# Patient Record
Sex: Male | Born: 1985
Health system: Southern US, Community
[De-identification: ages and names within clinical notes are randomized; demographics above are authoritative.]

## PROBLEM LIST (undated history)

## (undated) DIAGNOSIS — F419 Anxiety disorder, unspecified: Secondary | ICD-10-CM

## (undated) DIAGNOSIS — S0230XA Fracture of orbital floor, unspecified side, initial encounter for closed fracture: Secondary | ICD-10-CM

---

## 2004-08-18 ENCOUNTER — Emergency Department (HOSPITAL_COMMUNITY): Admission: EM | Admit: 2004-08-18 | Discharge: 2004-08-18 | Payer: Self-pay | Admitting: Emergency Medicine

## 2004-08-30 ENCOUNTER — Emergency Department (HOSPITAL_COMMUNITY): Admission: EM | Admit: 2004-08-30 | Discharge: 2004-08-30 | Payer: Self-pay | Admitting: Emergency Medicine

## 2004-09-01 ENCOUNTER — Emergency Department (HOSPITAL_COMMUNITY): Admission: EM | Admit: 2004-09-01 | Discharge: 2004-09-01 | Payer: Self-pay | Admitting: Emergency Medicine

## 2004-12-10 ENCOUNTER — Ambulatory Visit: Payer: Self-pay | Admitting: *Deleted

## 2005-01-08 ENCOUNTER — Ambulatory Visit: Payer: Self-pay

## 2005-01-08 ENCOUNTER — Ambulatory Visit: Payer: Self-pay | Admitting: *Deleted

## 2005-01-27 ENCOUNTER — Emergency Department (HOSPITAL_COMMUNITY): Admission: EM | Admit: 2005-01-27 | Discharge: 2005-01-28 | Payer: Self-pay | Admitting: Emergency Medicine

## 2007-08-04 ENCOUNTER — Emergency Department (HOSPITAL_COMMUNITY): Admission: EM | Admit: 2007-08-04 | Discharge: 2007-08-04 | Payer: Self-pay | Admitting: Emergency Medicine

## 2011-07-30 LAB — I-STAT 8, (EC8 V) (CONVERTED LAB)
Acid-Base Excess: 1
BUN: 11
Bicarbonate: 27.6 — ABNORMAL HIGH
Chloride: 106
Glucose, Bld: 92
HCT: 47
Hemoglobin: 16
Operator id: 285491
Potassium: 3.9
Sodium: 140
TCO2: 29
pCO2, Ven: 48.3
pH, Ven: 7.365 — ABNORMAL HIGH

## 2013-12-24 ENCOUNTER — Emergency Department (HOSPITAL_COMMUNITY): Payer: Self-pay

## 2013-12-24 ENCOUNTER — Emergency Department (HOSPITAL_COMMUNITY)
Admission: EM | Admit: 2013-12-24 | Discharge: 2013-12-24 | Disposition: A | Discharge status: Home / Self Care | Payer: Self-pay | Attending: Emergency Medicine | Admitting: Emergency Medicine

## 2013-12-24 ENCOUNTER — Encounter (HOSPITAL_COMMUNITY): Payer: Self-pay | Admitting: Emergency Medicine

## 2013-12-24 DIAGNOSIS — R0789 Other chest pain: Secondary | ICD-10-CM | POA: Insufficient documentation

## 2013-12-24 DIAGNOSIS — R42 Dizziness and giddiness: Secondary | ICD-10-CM | POA: Insufficient documentation

## 2013-12-24 DIAGNOSIS — F17200 Nicotine dependence, unspecified, uncomplicated: Secondary | ICD-10-CM | POA: Insufficient documentation

## 2013-12-24 DIAGNOSIS — Z880 Allergy status to penicillin: Secondary | ICD-10-CM | POA: Insufficient documentation

## 2013-12-24 DIAGNOSIS — R0602 Shortness of breath: Secondary | ICD-10-CM | POA: Insufficient documentation

## 2013-12-24 LAB — BASIC METABOLIC PANEL
BUN: 11 mg/dL (ref 6–23)
CO2: 24 mEq/L (ref 19–32)
Calcium: 9.7 mg/dL (ref 8.4–10.5)
Chloride: 100 mEq/L (ref 96–112)
Creatinine, Ser: 0.89 mg/dL (ref 0.50–1.35)
GFR calc Af Amer: >90 mL/min (ref >90)
GFR calc non Af Amer: >90 mL/min (ref >90)
Glucose, Bld: 102 mg/dL — ABNORMAL HIGH (ref 70–99)
Potassium: 3.4 mEq/L — ABNORMAL LOW (ref 3.7–5.3)
Sodium: 139 mEq/L (ref 137–147)

## 2013-12-24 LAB — CBC WITH DIFFERENTIAL/PLATELET
Basophils Absolute: 0 10*3/uL (ref 0.0–0.1)
Basophils Relative: 0 % (ref 0–1)
Eosinophils Absolute: 0.1 10*3/uL (ref 0.0–0.7)
Eosinophils Relative: 1 % (ref 0–5)
HCT: 40.9 % (ref 39.0–52.0)
Hemoglobin: 14.4 g/dL (ref 13.0–17.0)
Lymphocytes Relative: 33 % (ref 12–46)
Lymphs Abs: 2.3 10*3/uL (ref 0.7–4.0)
MCH: 32 pg (ref 26.0–34.0)
MCHC: 35.2 g/dL (ref 30.0–36.0)
MCV: 90.9 fL (ref 78.0–100.0)
Monocytes Absolute: 0.6 10*3/uL (ref 0.1–1.0)
Monocytes Relative: 9 % (ref 3–12)
Neutro Abs: 4 10*3/uL (ref 1.7–7.7)
Neutrophils Relative %: 57 % (ref 43–77)
Platelets: 196 10*3/uL (ref 150–400)
RBC: 4.5 MIL/uL (ref 4.22–5.81)
RDW: 11.7 % (ref 11.5–15.5)
WBC: 7 10*3/uL (ref 4.0–10.5)

## 2013-12-24 LAB — I-STAT TROPONIN, ED: Troponin i, poc: 0 ng/mL (ref 0.00–0.08)

## 2013-12-24 MED ORDER — LORAZEPAM 1 MG PO TABS: 1.0000 mg | ORAL_TABLET | Freq: Three times a day (TID) | ORAL | Status: DC | PRN

## 2013-12-24 MED ORDER — LORAZEPAM 1 MG PO TABS
1.0000 mg | ORAL_TABLET | Freq: Once | ORAL | Status: AC
  Administered 2013-12-24: 1 mg via ORAL
  Filled 2013-12-24: qty 1

## 2013-12-24 NOTE — ED Provider Notes (Signed)
CSN: [id number]     Arrival date & time 12/24/13  0017 History   First MD Initiated Contact with Patient 12/24/13 0129     Chief Complaint  Patient presents with  . Shortness of Breath     (Consider location/radiation/quality/duration/timing/severity/associated sxs/prior Treatment) HPI Jose Mccullough is a 28 y.o. male who presents emergency department complaining of shortness of breath. He states his shortness of breath began when he got home after work and was relaxing. He states it started after he took a shower with sitting on a couch. He states he has been feeling dizzy the last 2 days, otherwise no symptoms. He denies any associated chest pain, dizziness, diaphoresis, lightheadedness. He denies any history of any similar symptoms in the past. He denies any medical problems. He states he has been under a lot of stress recently. He states he's eating and drinking and sleeping well. He denies any recent travels or surgeries. He denies any swelling in his extremities. He denies any exertional chest pain or shortness of breath. He states this is an emergency department his symptoms have improved. He denies any fever, chills. He denies any cough or congestion.  History reviewed. No pertinent past medical history. History reviewed. No pertinent past surgical history. No family history on file. History  Substance Use Topics  . Smoking status: Current Every Day Smoker    Types: Cigarettes  . Smokeless tobacco: Not on file  . Alcohol Use: Yes     Comment: daily    Review of Systems  Constitutional: Negative for fever and chills.  Respiratory: Positive for chest tightness and shortness of breath. Negative for cough.   Cardiovascular: Negative for chest pain, palpitations and leg swelling.  Gastrointestinal: Negative for nausea, vomiting, abdominal pain, diarrhea and abdominal distention.  Genitourinary: Negative for dysuria, urgency, frequency and hematuria.  Musculoskeletal: Negative for  arthralgias, myalgias, neck pain and neck stiffness.  Skin: Negative for rash.  Allergic/Immunologic: Negative for immunocompromised state.  Neurological: Positive for dizziness and light-headedness. Negative for weakness, numbness and headaches.      Allergies  Penicillins  Home Medications  No current outpatient prescriptions on file. BP 137/93  Pulse 101  Temp(Src) 97.8 F (36.6 C) (Oral)  Resp 20  Ht 5\' 11"  (1.803 m)  Wt 155 lb (70.308 kg)  BMI 21.63 kg/m2  SpO2 100% Physical Exam  Nursing note and vitals reviewed. Constitutional: He appears well-developed and well-nourished. No distress.  HENT:  Head: Normocephalic and atraumatic.  Eyes: Conjunctivae are normal.  Neck: Neck supple.  Cardiovascular: Normal rate, regular rhythm and normal heart sounds.   Pulmonary/Chest: Effort normal. No respiratory distress. He has no wheezes. He has no rales. He exhibits no tenderness.  Musculoskeletal: He exhibits no edema.  Neurological: He is alert.  Skin: Skin is warm and dry.    ED Course  Procedures (including critical care time) Labs Review Labs Reviewed  BASIC METABOLIC PANEL - Abnormal; Notable for the following:    Potassium 3.4 (*)    Glucose, Bld 102 (*)    All other components within normal limits  CBC WITH DIFFERENTIAL  I-STAT TROPOININ, ED   Imaging Review Dg Chest 2 View  12/24/2013   CLINICAL DATA:  Shortness of breath with legs in the chest.  Smoker.  EXAM: CHEST  2 VIEW  COMPARISON:  08/30/2004.  FINDINGS: The heart size and mediastinal contours are within normal limits. Both lungs are clear. The visualized skeletal structures are unremarkable. Similar appearance to priors.  IMPRESSION: No  active cardiopulmonary disease.   Electronically Signed   By: Davonna BellingJohn  Curnes M.D.   On: 12/24/2013 02:41     EKG Interpretation None      MDM   Final diagnoses:  Shortness of breath  Dizziness   Patient with atypical chest pressure, shortness of breath that was at  rest. He denies any exertional symptoms ever. His exam is unremarkable. He is in no distress. His vital signs are normal. He is afebrile. EKG showing sinus bradycardia otherwise normal. Labs including troponin was checked and are negative. Chest x-ray is negative. Patient admits to a lot of stress recently and I have given him Ativan because he appeared slightly anxious on exam. Patient states that his symptoms did improve on her Ativan and currently he is asymptomatic. Patient is concerned about this chest pressure shortness of breath and requesting cardiology referral. Will refer to cardiology on call. He is also instructed to followup with her primary care Dr. Sheila Oatsesource guide provided. Patient is a low risk patient for ACS. No recent travel or risk factors for a PE. He is currently symptom-free. Stable for discharge home with close followup.  Filed Vitals:   12/24/13 0026 12/24/13 0042 12/24/13 0418  BP: 137/93  107/71  Pulse: 101  56  Temp: 97.8 F (36.6 C)    TempSrc: Oral    Resp: 20  17  Height:  5\' 11"  (1.803 m)   Weight:  155 lb (70.308 kg)   SpO2: 100%  100%      Lottie Musselatyana A Zach Tietje, PA-C 12/24/13 [phone]

## 2013-12-24 NOTE — Discharge Instructions (Signed)
Avoid stress. Drink plenty of fluids. If continue to have dizzy episodes or shortness of breath, follow up with primary care doctor or cardiology. Ativan as needed for anxiety. Return if symptoms are worsening.    Shortness of Breath Shortness of breath means you have trouble breathing. Shortness of breath may indicate that you have a medical problem. You should seek immediate medical care for shortness of breath. CAUSES   Not enough oxygen in the air (as with high altitudes or a smoke-filled room).  Short-term (acute) lung disease, including:  Infections, such as pneumonia.  Fluid in the lungs, such as heart failure.  A blood clot in the lungs (pulmonary embolism).  Long-term (chronic) lung diseases.  Heart disease (heart attack, angina, heart failure, and others).  Low red blood cells (anemia).  Poor physical fitness. This can cause shortness of breath when you exercise.  Chest or back injuries or stiffness.  Being overweight.  Smoking.  Anxiety. This can make you feel like you are not getting enough air. DIAGNOSIS  Serious medical problems can usually be found during your physical exam. Tests may also be done to determine why you are having shortness of breath. Tests may include:  Chest X-rays.  Lung function tests.  Blood tests.  Electrocardiography.  Exercise testing.  Echocardiography.  Imaging scans. Your caregiver may not be able to find a cause for your shortness of breath after your exam. In this case, it is important to have a follow-up exam with your caregiver as directed.  TREATMENT  Treatment for shortness of breath depends on the cause of your symptoms and can vary greatly. HOME CARE INSTRUCTIONS   Do not smoke. Smoking is a common cause of shortness of breath. If you smoke, ask for help to quit.  Avoid being around chemicals or things that may bother your breathing, such as paint fumes and dust.  Rest as needed. Slowly resume your usual  activities.  If medicines were prescribed, take them as directed for the full length of time directed. This includes oxygen and any inhaled medicines.  Keep all follow-up appointments as directed by your caregiver. SEEK MEDICAL CARE IF:   Your condition does not improve in the time expected.  You have a hard time doing your normal activities even with rest.  You have any side effects or problems with the medicines prescribed.  You develop any new symptoms. SEEK IMMEDIATE MEDICAL CARE IF:   Your shortness of breath gets worse.  You feel lightheaded, faint, or develop a cough not controlled with medicines.  You start coughing up blood.  You have pain with breathing.  You have chest pain or pain in your arms, shoulders, or abdomen.  You have a fever.  You are unable to walk up stairs or exercise the way you normally do. MAKE SURE YOU:  Understand these instructions.  Will watch your condition.  Will get help right away if you are not doing well or get worse. Document Released: 07/01/2001 Document Revised: 04/06/2012 Document Reviewed: 12/22/2011 [facility] Patient Information 2014 [place], [place].    Emergency Department Resource Guide 1) Find a Doctor and Pay Out of Pocket Although you won't have to find out who is covered by your insurance plan, it is a good idea to ask around and get recommendations. You will then need to call the office and see if the doctor you have chosen will accept you as a new patient and what types of options they offer for patients who are self-pay. Some  doctors offer discounts or will set up payment plans for their patients who do not have insurance, but you will need to ask so you aren't surprised when you get to your appointment.  2) Contact Your Local Health Department Not all health departments have doctors that can see patients for sick visits, but many do, so it is worth a call to see if yours does. If you don't know where your local health  department is, you can check in your phone book. The CDC also has a tool to help you locate your state's health department, and many state websites also have listings of all of their local health departments.  3) Find a Walk-in Clinic If your illness is not likely to be very severe or complicated, you may want to try a walk in clinic. These are popping up all over the country in pharmacies, drugstores, and shopping centers. They're usually staffed by nurse practitioners or physician assistants that have been trained to treat common illnesses and complaints. They're usually fairly quick and inexpensive. However, if you have serious medical issues or chronic medical problems, these are probably not your best option.  No Primary Care Doctor: - Call Health Connect at  [phone] - they can help you locate a primary care doctor that  accepts your insurance, provides certain services, etc. - Physician Referral Service- [phone]  Chronic Pain Problems: Organization         Address  Phone   Notes  [facility]  [phone] Patients need to be referred by their primary care doctor.   Medication Assistance: Organization         Address  Phone   Notes  [facility] Medication [facility] [address] [place] [place], [place] [address] [phone] --Must be a resident of [facility] -- Must have NO insurance coverage whatsoever (no Medicaid/ Medicare, etc.) -- The pt. MUST have a primary care doctor that directs their care regularly and follows them in the community   [facility]  [phone]   [employer]  [phone]    Agencies that provide inexpensive medical care: Organization         Address  Phone   Notes  [facility]  [phone]   [facility]    [phone]   [facility] [address] [place], [place] [address] [phone]   [facility] [address] [place] [place] [phone]   Planned Parenthood    [phone]   [facility]    [phone]   [facility]  [address], [place] Phone:  [phone], Fax:  [phone] Hours of Operation:  9 am - 6 pm, M-F.  Also accepts Medicaid/Medicare and self-pay.  [facility]  [address], [place] Phone: [phone], Fax: [phone]. Hours of Operation:  8:30 am - 5:30 pm, M-F.  Also accepts Medicaid and self-pay.  [facility] [place] [address], [place] Phone: [phone]   [facility] [address] Natasha Bence [place], [place] [phone], Ext. [phone] Mondays & Thursdays: 7-9 AM.  First 15 patients are seen on a first come, first serve basis.    Medicaid-accepting [facility] Providers:  Organization         Address  Phone   Notes  [facility] [address], [place] [phone] Also accepts self-pay patients.  [facility] [address] Laurell Josephs [place], [place]  [phone]   [facility] [address], [place] [phone]   [facility] [address] [place] [phone]   Renaye Rakers [address], [place]   [phone] Only accepts [place] patients after they have their name applied to their card.   Self-Pay (no insurance) in [facility]:  Organization         Address  Phone   Notes  Sickle Cell Patients, [facility] [address] [place], [place] [phone]   [facility] [address] [place], [place] [phone]   [facility] [place]  [address],  [phone]   [facility]/Dr. Osei-Bonsu  [address] [place] or [address], [place] [phone] Phone number for both [place] and  [place] locations is the same.  [facility] [address] [facility] [address], [place] [phone]   [facility] [address], [place] or [address] [phone]   [facility] [address], [place] [phone], phone; [phone], fax Sees patients 1st and 3rd Saturday of every month.  Must not qualify for public or private insurance (i.e. Medicaid, Medicare, [place] Health Choice, Veterans' Benefits)  Household income should be no more than 200% of the poverty level The clinic cannot treat you if you are pregnant or think you are pregnant  Sexually transmitted diseases are not treated at the clinic.    Dental Care: Organization         Address  Phone  Notes  [facility] [address] [place], [place] [phone] Accepts children up to age 14 who are enrolled in [place] or [place] Health Choice; pregnant women with a Medicaid card; and children who have applied for Medicaid or [place] Health Choice, but were declined, whose parents can pay a reduced fee at time of service.  [facility] Department of [facility]  [address], [place] [phone] Accepts children up to age 33 who are enrolled in [place] or [place] Health Choice; pregnant women with a Medicaid card; and children who have applied for Medicaid or [place] Health Choice, but were declined, whose parents can pay a reduced fee at time of service.  [facility]  [address] [place], [place] [phone] Patients are seen by appointment only. Walk-ins are not accepted. [facility] will see patients 40 years of age and older. Monday - Tuesday (8am-5pm) Most Wednesdays (8:30-5pm) $30 per visit, cash only  [facility]  [address], [facility] [phone] Patients are seen by appointment only. Walk-ins are not accepted.  [facility] will see patients 34 years of age and older. One Wednesday Evening (Monthly: Volunteer Based).  $30 per visit, cash only  [employer]  [phone] for adults; Children under age 43, call Graduate Pediatric Dentistry at [phone]. Children aged 51-14, please call [phone] to request a pediatric application.  Dental services are provided in all areas of dental care including fillings, crowns and bridges, complete and partial dentures, implants, gum treatment, root canals, and extractions. Preventive  care is also provided. Treatment is provided to both adults and children. Patients are selected via a lottery and there is often a waiting list.   [facility] [address], [place]  [phone] [email or web address]   [facility] [address], [place], [place] [phone], Ext. [phone] Second and Fourth Thursday of each month, opens at 6:30 AM; Clinic ends at 9 AM.  Patients are seen on a first-come first-served basis, and a limited number are seen during each clinic.   [facility]  [address], [place], [place] [phone]   Eligibility Requirements You must have lived in [place], [place], or [place] counties for at least the last three months.   You cannot be eligible for state or federal sponsored [place] Cityhealthcare insurance, including CIGNAVeterans Administration, [place]icaid, or [employer] EntertainmentMedicare.   You generally cannot be eligible for healthcare insurance through your employer.    How to apply: Eligibility screenings are held every Tuesday and Wednesday afternoon from 1:00 pm until 4:00 pm. You do not need an appointment for the interview!  [facility] [address], [place], [place] [phone]   [facility]  [phone]   [facility]  [phone]   [facility]  [phone]    Behavioral Health Resources in the  Community: Intensive Outpatient Programs Organization         Address  Phone  Notes  [facility] [address], [place], [place] [phone]   [facility] [address], [place], [place] [phone]   [facility] [address], [place], [place]  [phone]   [facility] [address],  [place], [place] [phone] or [phone]   Substance Abuse Resources Organization         Address  Phone  Notes  [facility]  [phone]   [facility]  [phone]   [facility]  [phone]   [facility]  [phone]   Residential & Outpatient Substance Abuse Program  [phone]   Psychological Services Organization         Address  Phone  Notes  [facility]  [phone]- [phone]   [facility]  [phone]- [phone]   [facility] [address], [place] [phone] or [phone]    Mobile Crisis Teams Organization         Address  Phone  Notes  [facility], Mobile Crisis Care Unit  [phone]   [facility]  [address] [place], [place] [phone]   Doristine LocksSharon DeEsch [address] [place] [phone]    Self-Help/Support Groups Organization         Address  Phone             Notes  [facility] [place] - variety of support groups  336- [phone] Call for more information  Narcotics Anonymous (NA), [facility] [address], [place]  2 meetings at this location   Statisticianesidential Treatment Programs Organization         Address  Phone  Notes  [facility] [address],    [place]  [phone]   [facility]  [address], [place] [phone], [place], [place] [phone]   [facility] [address], [place] [phone] Admissions: 8am-3pm M-F  [facility] [address]  [place], [place]   [facility] [address] Jadene Pierini [place], [place]   The [place].,  [place], [place]   [facility] - Intensive Outpatient [address], [place], [place]   [facility] [address]  [place], [place] [phone] or [phone]   [facility] ([facility]) [address] [place], [place] Accepts Medicaid  [facility] [address],  [place] [phone] Substance Abuse/Addiction Treatment   [facility] Organization         Address  Phone  Notes  [facility]  [phone]   Domenic Schwab, PhD [address] Arlis Porta [place], [place]   [phone] or [phone]   [facility] [place], [place] [phone]   [facility] [address], [place], [place] [phone] Insurance/Medicaid/sponsorship through [facility] and Families [address] [address]                                    [place], [place] [phone] [place] [address], [place] [phone]    Dr. Adele Schilder  [phone]   [facility] Dept. 1) [address], [place] 2) [place] 3)  [place] [address], [place] [phone]  [phone]   [place] [phone] or [phone] (After Hours)

## 2013-12-24 NOTE — ED Provider Notes (Signed)
Medical screening examination/treatment/procedure(s) were performed by non-physician practitioner and as supervising physician I was immediately available for consultation/collaboration.     Julie Manly, MD 12/24/13 0715 

## 2013-12-24 NOTE — ED Notes (Signed)
Pt states he got off work @ [employer] tonight, went home, showered after sitting down became [facility], pt c/o continually feeling he may pass out. A & O at this time.

## 2014-12-21 ENCOUNTER — Emergency Department (HOSPITAL_COMMUNITY)
Admission: EM | Admit: 2014-12-21 | Discharge: 2014-12-21 | Disposition: A | Discharge status: Home / Self Care | Payer: Self-pay | Attending: Emergency Medicine | Admitting: Emergency Medicine

## 2014-12-21 ENCOUNTER — Encounter (HOSPITAL_COMMUNITY): Payer: Self-pay | Admitting: Emergency Medicine

## 2014-12-21 ENCOUNTER — Emergency Department (HOSPITAL_COMMUNITY): Payer: Self-pay

## 2014-12-21 DIAGNOSIS — K5900 Constipation, unspecified: Secondary | ICD-10-CM | POA: Insufficient documentation

## 2014-12-21 DIAGNOSIS — Z720 Tobacco use: Secondary | ICD-10-CM | POA: Insufficient documentation

## 2014-12-21 DIAGNOSIS — Z880 Allergy status to penicillin: Secondary | ICD-10-CM | POA: Insufficient documentation

## 2014-12-21 DIAGNOSIS — Z79899 Other long term (current) drug therapy: Secondary | ICD-10-CM | POA: Insufficient documentation

## 2014-12-21 DIAGNOSIS — R110 Nausea: Secondary | ICD-10-CM | POA: Insufficient documentation

## 2014-12-21 DIAGNOSIS — R42 Dizziness and giddiness: Secondary | ICD-10-CM | POA: Insufficient documentation

## 2014-12-21 LAB — COMPREHENSIVE METABOLIC PANEL
ALT: 24 U/L (ref 0–53)
AST: 21 U/L (ref 0–37)
Albumin: 4.5 g/dL (ref 3.5–5.2)
Alkaline Phosphatase: 48 U/L (ref 39–117)
Anion gap: 7 (ref 5–15)
BUN: 8 mg/dL (ref 6–23)
CO2: 25 mmol/L (ref 19–32)
Calcium: 9.3 mg/dL (ref 8.4–10.5)
Chloride: 109 mmol/L (ref 96–112)
Creatinine, Ser: 0.88 mg/dL (ref 0.50–1.35)
GFR calc Af Amer: >90 mL/min (ref >90)
GFR calc non Af Amer: >90 mL/min (ref >90)
Glucose, Bld: 91 mg/dL (ref 70–99)
Potassium: 4 mmol/L (ref 3.5–5.1)
Sodium: 141 mmol/L (ref 135–145)
TOTAL PROTEIN: 7.3 g/dL (ref 6.0–8.3)
Total Bilirubin: 1.4 mg/dL — ABNORMAL HIGH (ref 0.3–1.2)

## 2014-12-21 LAB — CBC
HCT: 43.1 % (ref 39.0–52.0)
Hemoglobin: 14.9 g/dL (ref 13.0–17.0)
MCH: 33.3 pg (ref 26.0–34.0)
MCHC: 34.6 g/dL (ref 30.0–36.0)
MCV: 96.4 fL (ref 78.0–100.0)
Platelets: 207 10*3/uL (ref 150–400)
RBC: 4.47 MIL/uL (ref 4.22–5.81)
RDW: 11.9 % (ref 11.5–15.5)
WBC: 5.5 10*3/uL (ref 4.0–10.5)

## 2014-12-21 LAB — I-STAT TROPONIN, ED: Troponin i, poc: 0 ng/mL (ref 0.00–0.08)

## 2014-12-21 LAB — CBG MONITORING, ED: Glucose-Capillary: 93 mg/dL (ref 70–99)

## 2014-12-21 MED ORDER — POLYETHYLENE GLYCOL 3350 17 G PO PACK: 17.0000 g | PACK | Freq: Every day | ORAL | Status: DC

## 2014-12-21 MED ORDER — SODIUM CHLORIDE 0.9 % IV BOLUS (SEPSIS)
1000.0000 mL | Freq: Once | INTRAVENOUS | Status: AC
  Administered 2014-12-21: 1000 mL via INTRAVENOUS

## 2014-12-21 NOTE — ED Notes (Signed)
Pt states that he has been feeling faint x every day for 2 wks.  States he is "kind of nauseous" and has been constipated.  Last normal BM was a week ago.  Since then BMs have been hard.

## 2014-12-21 NOTE — ED Provider Notes (Signed)
CSN: [id number]     Arrival date & time 12/21/14  1524 History   First MD Initiated Contact with Patient 12/21/14 1534     Chief Complaint  Patient presents with  . Dizziness     (Consider location/radiation/quality/duration/timing/severity/associated sxs/prior Treatment) The history is provided by the patient.  Jose Mccullough is a 29 y.o. male smoker here presenting with lightheadedness and dizziness, constipation. Has been feeling lightheaded and dizzy for the last 2 weeks. He felt like passing out but hasn't actually passed out. He had intermittent left-sided chest pain going on for 2 weeks. Denies any shortness of breath. Has been drinking and eating well but sometimes get nauseated but no vomiting. He's been having some hard bowel movements the last week. Denies any fevers or chills or abdominal pain. Otherwise healthy with no medical problems.    History reviewed. No pertinent past medical history. No past surgical history on file. No family history on file. History  Substance Use Topics  . Smoking status: Current Every Day Smoker    Types: Cigarettes  . Smokeless tobacco: Not on file  . Alcohol Use: Yes     Comment: daily    Review of Systems  Neurological: Positive for dizziness.  All other systems reviewed and are negative.     Allergies  Ativan and Penicillins  Home Medications   Prior to Admission medications   Medication Sig Start Date End Date Taking? Authorizing Provider  Cyanocobalamin (VITAMIN B-12 PO) Take 1 tablet by mouth daily.   Yes Historical Provider, MD  LORazepam (ATIVAN) 1 MG tablet Take 1 tablet (1 mg total) by mouth 3 (three) times daily as needed for anxiety. Patient not taking: Reported on 12/21/2014 12/24/13   Tatyana A Kirichenko, PA-C   BP 122/82 mmHg  Pulse 49  Temp(Src) 98.3 F (36.8 C) (Oral)  Resp 19  SpO2 100% Physical Exam  Constitutional: He is oriented to person, place, and time. He appears well-developed and well-nourished.   HENT:  Head: Normocephalic.  Mouth/Throat: Oropharynx is clear and moist.  Eyes: Conjunctivae are normal. Pupils are equal, round, and reactive to light.  Neck: Normal range of motion. Neck supple.  Cardiovascular: Normal rate, regular rhythm and normal heart sounds.   Pulmonary/Chest: Effort normal and breath sounds normal. No respiratory distress. He has no wheezes. He has no rales.  Abdominal: Soft. Bowel sounds are normal. He exhibits no distension. There is no tenderness. There is no rebound.  Musculoskeletal: Normal range of motion. He exhibits no edema or tenderness.  Neurological: He is alert and oriented to person, place, and time. No cranial nerve deficit. Coordination normal.  Skin: Skin is warm and dry.  Psychiatric: He has a normal mood and affect. His behavior is normal. Judgment and thought content normal.  Nursing note and vitals reviewed.   ED Course  Procedures (including critical care time) Labs Review Labs Reviewed  COMPREHENSIVE METABOLIC PANEL - Abnormal; Notable for the following:    Total Bilirubin 1.4 (*)    All other components within normal limits  CBC  CBG MONITORING, ED  Rosezena Sensor, ED    Imaging Review Dg Chest 2 View  12/21/2014   CLINICAL DATA:  Chest pain for 2 weeks.  EXAM: CHEST  2 VIEW  COMPARISON:  December 24, 2013.  FINDINGS: The heart size and mediastinal contours are within normal limits. Both lungs are clear. No pneumothorax or pleural effusion is noted. The visualized skeletal structures are unremarkable.  IMPRESSION: No acute cardiopulmonary abnormality seen.  Electronically Signed   By: Lupita RaiderJames  Green Jr, M.D.   On: 12/21/2014 16:24   Dg Abd 1 View  12/21/2014   CLINICAL DATA:  Five day history of constipation  EXAM: ABDOMEN - 1 VIEW  COMPARISON:  None.  FINDINGS: There is moderate stool throughout the colon. Colon is not distended with stool. Overall bowel gas pattern is unremarkable. No obstruction or free air. There is a phlebolith in  the right pelvis. Lung bases are clear.  IMPRESSION: Moderate stool in colon.  Overall bowel gas pattern unremarkable.   Electronically Signed   By: Bretta BangWilliam  Woodruff III M.D.   On: 12/21/2014 16:13     EKG Interpretation   Date/Time:  Thursday December 21 2014 15:38:39 EST Ventricular Rate:  58 PR Interval:  141 QRS Duration: 90 QT Interval:  403 QTC Calculation: 396 R Axis:   94 Text Interpretation:  Sinus arrhythmia Borderline right axis deviation  Baseline wander in lead(s) II III aVF No significant change since last  tracing Confirmed by Ceazia Harb  MD, Rayden Scheper ([id number]) on 12/21/2014 3:41:10 PM      MDM   Final diagnoses:  None   Jose Mccullough is a 29 y.o. male here with near syncope, constipation. Symptoms for 2 weeks. Likely dehydration from constipation. Will get labs, trop x 1, cxr and ab xray and orthostatics.   5:01 PM Not orthostatic. Given IVF. Trop neg x 1. Xray showed mod constipation. Recommend miralax daily until bowel movement.      Richardean Canalavid H Djuna Frechette, MD 12/21/14 [phone]

## 2014-12-21 NOTE — ED Notes (Signed)
'  Afib' on the monitor. Beat irregular but Pwaves visible.

## 2014-12-21 NOTE — Progress Notes (Signed)
EDCM spoke to patient at bedside.  Patient listed as not having a pcp or insurance.  Patient reports he plans on getting insurance with his job, he just doesn't know when.  Patient confirms he has made an appointment to see Dr. Kateri PlummerMorrow of [facility] on March 17th.  Patient reports he gets his prescriptions [facility] at [facility].  [facility]ies and website [employer]com to assist with costs of medications until he is covered by insurance.  Patient is thankful for resources.  No further EDCM needs at this time.

## 2014-12-21 NOTE — Discharge Instructions (Signed)
Stay hydrated.   Take miralax twice daily until you have soft bowel movement then change to daily.   Follow up with your doctor.   Return to ER if you have severe pain, vomiting, passing out, chest pain.

## 2015-11-16 ENCOUNTER — Emergency Department (HOSPITAL_BASED_OUTPATIENT_CLINIC_OR_DEPARTMENT_OTHER)
Admission: EM | Admit: 2015-11-16 | Discharge: 2015-11-16 | Disposition: A | Discharge status: Home / Self Care | Payer: Self-pay | Attending: Emergency Medicine | Admitting: Emergency Medicine

## 2015-11-16 ENCOUNTER — Emergency Department (HOSPITAL_BASED_OUTPATIENT_CLINIC_OR_DEPARTMENT_OTHER): Payer: Self-pay

## 2015-11-16 ENCOUNTER — Encounter (HOSPITAL_BASED_OUTPATIENT_CLINIC_OR_DEPARTMENT_OTHER): Payer: Self-pay | Admitting: *Deleted

## 2015-11-16 DIAGNOSIS — Z79899 Other long term (current) drug therapy: Secondary | ICD-10-CM | POA: Insufficient documentation

## 2015-11-16 DIAGNOSIS — Z87891 Personal history of nicotine dependence: Secondary | ICD-10-CM | POA: Insufficient documentation

## 2015-11-16 DIAGNOSIS — Z880 Allergy status to penicillin: Secondary | ICD-10-CM | POA: Insufficient documentation

## 2015-11-16 DIAGNOSIS — R531 Weakness: Secondary | ICD-10-CM | POA: Insufficient documentation

## 2015-11-16 DIAGNOSIS — R5383 Other fatigue: Secondary | ICD-10-CM | POA: Insufficient documentation

## 2015-11-16 LAB — CBC WITH DIFFERENTIAL/PLATELET
BASOS ABS: 0 10*3/uL (ref 0.0–0.1)
BASOS PCT: 0 %
Eosinophils Absolute: 0.2 10*3/uL (ref 0.0–0.7)
Eosinophils Relative: 2 %
HEMATOCRIT: 42 % (ref 39.0–52.0)
HEMOGLOBIN: 14 g/dL (ref 13.0–17.0)
LYMPHS PCT: 50 %
Lymphs Abs: 3.9 10*3/uL (ref 0.7–4.0)
MCH: 31 pg (ref 26.0–34.0)
MCHC: 33.3 g/dL (ref 30.0–36.0)
MCV: 92.9 fL (ref 78.0–100.0)
Monocytes Absolute: 0.8 10*3/uL (ref 0.1–1.0)
Monocytes Relative: 11 %
NEUTROS ABS: 2.9 10*3/uL (ref 1.7–7.7)
NEUTROS PCT: 37 %
Platelets: 214 10*3/uL (ref 150–400)
RBC: 4.52 MIL/uL (ref 4.22–5.81)
RDW: 11.2 % — ABNORMAL LOW (ref 11.5–15.5)
WBC: 7.9 10*3/uL (ref 4.0–10.5)

## 2015-11-16 LAB — BASIC METABOLIC PANEL
Anion gap: 10 (ref 5–15)
BUN: 12 mg/dL (ref 6–20)
CO2: 25 mmol/L (ref 22–32)
Calcium: 9.1 mg/dL (ref 8.9–10.3)
Chloride: 107 mmol/L (ref 101–111)
Creatinine, Ser: 0.92 mg/dL (ref 0.61–1.24)
Glucose, Bld: 91 mg/dL (ref 65–99)
Potassium: 3.9 mmol/L (ref 3.5–5.1)
Sodium: 142 mmol/L (ref 135–145)

## 2015-11-16 LAB — CBG MONITORING, ED: Glucose-Capillary: 85 mg/dL (ref 65–99)

## 2015-11-16 NOTE — ED Notes (Signed)
Dizziness and fatigue for a few days. No pain.

## 2015-11-16 NOTE — Discharge Instructions (Signed)
You were evaluated in the ED today and there does not appear to be an emergent cause for your symptoms at this time. Her exam is reassuring as were your labs, EKG, chest x-ray and vital signs. Follow-up with your doctor as needed for reevaluation. Return to ED for any new or worsening symptoms.  Fatigue Fatigue is feeling tired all of the time, a lack of energy, or a lack of motivation. Occasional or mild fatigue is often a normal response to activity or life in general. However, long-lasting (chronic) or extreme fatigue may indicate an underlying medical condition. HOME CARE INSTRUCTIONS  Watch your fatigue for any changes. The following actions may help to lessen any discomfort you are feeling:  Talk to your health care provider about how much sleep you need each night. Try to get the required amount every night.  Take medicines only as directed by your health care provider.  Eat a healthy and nutritious diet. Ask your health care provider if you need help changing your diet.  Drink enough fluid to keep your urine clear or pale yellow.  Practice ways of relaxing, such as yoga, meditation, massage therapy, or acupuncture.  Exercise regularly.   Change situations that cause you stress. Try to keep your work and personal routine reasonable.  Do not abuse illegal drugs.  Limit alcohol intake to no more than 1 drink per day for nonpregnant women and 2 drinks per day for men. One drink equals 12 ounces of beer, 5 ounces of wine, or 1 ounces of hard liquor.  Take a multivitamin, if directed by your health care provider. SEEK MEDICAL CARE IF:   Your fatigue does not get better.  You have a fever.   You have unintentional weight loss or gain.  You have headaches.   You have difficulty:   Falling asleep.  Sleeping throughout the night.  You feel angry, guilty, anxious, or sad.   You are unable to have a bowel movement (constipation).   You skin is dry.   Your legs or  another part of your body is swollen.  SEEK IMMEDIATE MEDICAL CARE IF:   You feel confused.   Your vision is blurry.  You feel faint or pass out.   You have a severe headache.   You have severe abdominal, pelvic, or back pain.   You have chest pain, shortness of breath, or an irregular or fast heartbeat.   You are unable to urinate or you urinate less than normal.   You develop abnormal bleeding, such as bleeding from the rectum, vagina, nose, lungs, or nipples.  You vomit blood.   You have thoughts about harming yourself or committing suicide.   You are worried that you might harm someone else.    This information is not intended to replace advice given to you by your health care provider. Make sure you discuss any questions you have with your health care provider.   Document Released: 08/03/2007 Document Revised: 10/27/2014 Document Reviewed: 02/07/2014 Elsevier Interactive Patient Education [employer]

## 2015-11-16 NOTE — ED Notes (Signed)
Pt upset because we "didn't address his head". I went over labs, xrays and EKG with him as well as f/u. Discussed pt concerns with PA and he will talk with pt.

## 2015-11-16 NOTE — ED Provider Notes (Signed)
CSN: [id number]     Arrival date & time 11/16/15  1939 History   First MD Initiated Contact with Patient 11/16/15 1948     Chief Complaint  Patient presents with  . Dizziness     (Consider location/radiation/quality/duration/timing/severity/associated sxs/prior Treatment) HPI Jose Mccullough is a 30 y.o. male because of her evaluation of fatigue and dizziness. Patient reports he has had generalized weakness and fatigue over the past 3 days. He reports associated dizziness when trying to stand up. Denies any fevers, chills, chest pain, overt shortness of breath, nausea or vomiting, abdominal pain, diarrhea or constipation, bloody stools. Nothing makes his symptoms better or worse. He has not tried anything to improve his symptoms. No other modifying factors. Reports she recently stopped smoking cigarettes in favor of a vaping.  History reviewed. No pertinent past medical history. History reviewed. No pertinent past surgical history. No family history on file. Social History  Substance Use Topics  . Smoking status: Former Games developer  . Smokeless tobacco: None  . Alcohol Use: Yes     Comment: daily    Review of Systems A 10 point review of systems was completed and was negative except for pertinent positives and negatives as mentioned in the history of present illness     Allergies  Ativan and Penicillins  Home Medications   Prior to Admission medications   Medication Sig Start Date End Date Taking? Authorizing Provider  Cyanocobalamin (VITAMIN B-12 PO) Take 1 tablet by mouth daily.    Historical Provider, MD  LORazepam (ATIVAN) 1 MG tablet Take 1 tablet (1 mg total) by mouth 3 (three) times daily as needed for anxiety. Patient not taking: Reported on 12/21/2014 12/24/13   Tatyana Kirichenko, PA-C  polyethylene glycol (MIRALAX / GLYCOLAX) packet Take 17 g by mouth daily. 12/21/14   Richardean Canal, MD   BP 107/78 mmHg  Pulse 50  Temp(Src) 98.4 F (36.9 C) (Oral)  Resp 18  Ht  (1.803  m)  Wt 70.308 kg  BMI 21.63 kg/m2  SpO2 100% Physical Exam  Constitutional: He is oriented to person, place, and time. He appears well-developed and well-nourished.  Well-appearing African-American male  HENT:  Head: Normocephalic and atraumatic.  Mouth/Throat: Oropharynx is clear and moist.  Eyes: Conjunctivae and EOM are normal. Pupils are equal, round, and reactive to light. Right eye exhibits no discharge. Left eye exhibits no discharge. No scleral icterus.  No conjunctival pallor  Neck: Normal range of motion. Neck supple.  Cardiovascular: Normal rate, regular rhythm and normal heart sounds.   Pulmonary/Chest: Effort normal and breath sounds normal. No respiratory distress. He has no wheezes. He has no rales.  Abdominal: Soft. There is no tenderness.  Musculoskeletal: He exhibits no tenderness.  Neurological: He is alert and oriented to person, place, and time. No cranial nerve deficit. Coordination normal.  Cranial Nerves II-XII grossly intact. Motor strength and sensation are baseline. Gait is baseline.  Skin: Skin is warm and dry. No rash noted.  Psychiatric: He has a normal mood and affect.  Nursing note and vitals reviewed.   ED Course  Procedures (including critical care time) Labs Review Labs Reviewed  CBC WITH DIFFERENTIAL/PLATELET - Abnormal; Notable for the following:    RDW 11.2 (*)    All other components within normal limits  BASIC METABOLIC PANEL  CBG MONITORING, ED    Imaging Review Dg Chest 2 View  11/16/2015  CLINICAL DATA:  Dizziness, fatigue, and shortness of breath for 5 days. EXAM: CHEST  2 VIEW COMPARISON:  12/21/2014 FINDINGS: The heart size and mediastinal contours are within normal limits. Both lungs are clear. The visualized skeletal structures are unremarkable. IMPRESSION: No active cardiopulmonary disease. Electronically Signed   By: Burman Nieves M.D.   On: 11/16/2015 20:24   I have personally reviewed and evaluated these images and lab  results as part of my medical decision-making.   EKG Interpretation   Date/Time:  Friday November 16 2015 20:09:53 EST Ventricular Rate:  52 PR Interval:  144 QRS Duration: 90 QT Interval:  392 QTC Calculation: 364 R Axis:   79 Text Interpretation:  Sinus bradycardia Otherwise normal ECG since last  tracing no significant change Confirmed by BELFI  MD, MELANIE ([id number]) on  11/16/2015 8:45:20 PM     Meds given in ED:  Medications - No data to display  Discharge Medication List as of 11/16/2015  9:19 PM     Filed Vitals:   11/16/15 1943 11/16/15 2123  BP: 134/88 107/78  Pulse: 50 50  Temp: 98.4 F (36.9 C)   TempSrc: Oral   Resp: 20 18  Height:  (1.803 m)   Weight: 70.308 kg   SpO2: 100% 100%    MDM  Jose Mccullough is a 30 y.o. male with no significant past medical history comes in for evaluation of generalized fatigue. Patient reports dizziness upon standing. Denies any overt chest pain, shortness of breath, numbness or unilateral weakness. His exam is grossly unremarkable, neurovascularly intact with a baseline gait. We will obtain basic labs to evaluate for possible anemia, electrolyte disturbances. Will obtain EKG. Chest x-ray and Orthostatic vital signs. Labs are unremarkable, EKG reassuring, chest x-ray negative, not orthostatic. Encouraged rehydration at home and follow up with PCP. No evidence of other acute or emergent pathology at this time.  Final diagnoses:  Other fatigue        Joycie Peek, PA-C 11/17/15 1358  Rolan Bucco, MD 11/18/15 [phone]

## 2015-11-16 NOTE — ED Notes (Signed)
Pt c/o fatigue for a few days

## 2015-12-16 ENCOUNTER — Emergency Department (HOSPITAL_BASED_OUTPATIENT_CLINIC_OR_DEPARTMENT_OTHER)
Admission: EM | Admit: 2015-12-16 | Discharge: 2015-12-16 | Disposition: A | Discharge status: Home / Self Care | Payer: Self-pay | Attending: Emergency Medicine | Admitting: Emergency Medicine

## 2015-12-16 ENCOUNTER — Encounter (HOSPITAL_BASED_OUTPATIENT_CLINIC_OR_DEPARTMENT_OTHER): Payer: Self-pay

## 2015-12-16 ENCOUNTER — Emergency Department (HOSPITAL_BASED_OUTPATIENT_CLINIC_OR_DEPARTMENT_OTHER): Payer: Self-pay

## 2015-12-16 DIAGNOSIS — Z87891 Personal history of nicotine dependence: Secondary | ICD-10-CM | POA: Insufficient documentation

## 2015-12-16 DIAGNOSIS — B9789 Other viral agents as the cause of diseases classified elsewhere: Secondary | ICD-10-CM

## 2015-12-16 DIAGNOSIS — J069 Acute upper respiratory infection, unspecified: Secondary | ICD-10-CM | POA: Insufficient documentation

## 2015-12-16 DIAGNOSIS — Z880 Allergy status to penicillin: Secondary | ICD-10-CM | POA: Insufficient documentation

## 2015-12-16 NOTE — ED Notes (Signed)
Patient here with 1 week of dry cough and congestion, reports chills with same, no distress

## 2015-12-16 NOTE — ED Provider Notes (Signed)
CSN: [id number]     Arrival date & time 12/16/15  1447 History  By signing my name below, I, Linus Galas, attest that this documentation has been prepared under the direction and in the presence of Tilden Fossa, MD. Electronically Signed: Linus Galas, ED Scribe. 12/16/2015. 4:49 PM.   Chief Complaint  Patient presents with  . Cough  . Nasal Congestion    The history is provided by the patient. No language interpreter was used.   HPI Comments: Jose Mccullough is a 30 y.o. male with no PMHx who presents to the Emergency Department complaining of worsening dry cough with associated chest and nasal congestion that began 6 days ago. Pt also reports subjective fever and SOB. Pt denies any aggravating or alleviating factors. Pt takes Coricidin with mild relief. Pt denies any sore throat, vomiting, diarrhea, or any other symptoms at this time. Pt is allergic to Penicillin. Pt smokes.  History reviewed. No pertinent past medical history. History reviewed. No pertinent past surgical history. No family history on file. Social History  Substance Use Topics  . Smoking status: Former Games developer  . Smokeless tobacco: None  . Alcohol Use: Yes     Comment: daily    Review of Systems  Constitutional: Positive for fever. Negative for chills.  HENT: Positive for congestion. Negative for facial swelling and sore throat.   Eyes: Negative for discharge and visual disturbance.  Respiratory: Positive for cough and shortness of breath.   Cardiovascular: Negative for chest pain and palpitations.  Gastrointestinal: Negative for vomiting, abdominal pain and diarrhea.  Musculoskeletal: Negative for myalgias and arthralgias.  Skin: Negative for color change and rash.  Neurological: Negative for tremors, syncope and headaches.  Psychiatric/Behavioral: Negative for confusion and dysphoric mood.  All other systems reviewed and are negative.  Allergies  Ativan and Penicillins  Home Medications   Prior to  Admission medications   Not on File   BP 133/89 mmHg  Pulse 92  Temp(Src) 98.8 F (37.1 C) (Oral)  Resp 18  Ht  (1.803 m)  Wt 160 lb (72.576 kg)  BMI 22.33 kg/m2  SpO2 99%   Physical Exam  Constitutional: He is oriented to person, place, and time. He appears well-developed and well-nourished.  HENT:  Head: Normocephalic and atraumatic.  Cardiovascular: Normal rate and regular rhythm.   No murmur heard. Pulmonary/Chest: Effort normal and breath sounds normal. No respiratory distress.  Abdominal: Soft. There is no tenderness. There is no rebound and no guarding.  Musculoskeletal: He exhibits no edema or tenderness.  Neurological: He is alert and oriented to person, place, and time.  Skin: Skin is warm and dry.  Psychiatric: He has a normal mood and affect. His behavior is normal.  Nursing note and vitals reviewed.   ED Course  Procedures   DIAGNOSTIC STUDIES: Oxygen Saturation is 99% on room air, normal by my interpretation.    COORDINATION OF CARE: 4:43 PM Discussed treatment plan with pt at bedside and pt agreed to plan. Return precaution reviewed  Labs Review Labs Reviewed - No data to display  Imaging Review Dg Chest 2 View  12/16/2015  CLINICAL DATA:  30 year old male with cough, congestion, shortness of breath and fever for 6 days. EXAM: CHEST  2 VIEW COMPARISON:  11/16/2015 prior chest radiograph FINDINGS: The cardiomediastinal silhouette is unremarkable. There is no evidence of focal airspace disease, pulmonary edema, suspicious pulmonary nodule/mass, pleural effusion, or pneumothorax. No acute bony abnormalities are identified. IMPRESSION: No active cardiopulmonary disease. Electronically Signed  By: Harmon Pier M.D.   On: 12/16/2015 15:15   I have personally reviewed and evaluated these images and lab results as part of my medical decision-making.   EKG Interpretation None      MDM   Final diagnoses:  Viral URI with cough   Patient here with  cough and congestion. He is nontoxic appearing on examination with no respiratory distress. Presentation is not consistent with pneumonia, COPD exacerbation, PE. Patient with viral illness. Discussed outpatient follow-up with home care and return precautions.  I personally performed the services described in this documentation, which was scribed in my presence. The recorded information has been reviewed and is accurate.   Tilden Fossa, MD 12/16/15 [phone]

## 2015-12-16 NOTE — ED Notes (Signed)
MD at bedside. 

## 2015-12-16 NOTE — Discharge Instructions (Signed)
Upper Respiratory Infection, Adult Most upper respiratory infections (URIs) are a viral infection of the air passages leading to the lungs. A URI affects the nose, throat, and upper air passages. The most common type of URI is nasopharyngitis and is typically referred to as "the common cold." URIs run their course and usually go away on their own. Most of the time, a URI does not require medical attention, but sometimes a bacterial infection in the upper airways can follow a viral infection. This is called a secondary infection. Sinus and middle ear infections are common types of secondary upper respiratory infections. Bacterial pneumonia can also complicate a URI. A URI can worsen asthma and chronic obstructive pulmonary disease (COPD). Sometimes, these complications can require emergency medical care and may be life threatening.  CAUSES Almost all URIs are caused by viruses. A virus is a type of germ and can spread from one person to another.  RISKS FACTORS You may be at risk for a URI if:   You smoke.   You have chronic heart or lung disease.  You have a weakened defense (immune) system.   You are very young or very old.   You have nasal allergies or asthma.  You work in crowded or poorly ventilated areas.  You work in health care facilities or schools. SIGNS AND SYMPTOMS  Symptoms typically develop 2-3 days after you come in contact with a cold virus. Most viral URIs last 7-10 days. However, viral URIs from the influenza virus (flu virus) can last 14-18 days and are typically more severe. Symptoms may include:   Runny or stuffy (congested) nose.   Sneezing.   Cough.   Sore throat.   Headache.   Fatigue.   Fever.   Loss of appetite.   Pain in your forehead, behind your eyes, and over your cheekbones (sinus pain).  Muscle aches.  DIAGNOSIS  Your health care provider may diagnose a URI by:  Physical exam.  Tests to check that your symptoms are not due to  another condition such as:  Strep throat.  Sinusitis.  Pneumonia.  Asthma. TREATMENT  A URI goes away on its own with time. It cannot be cured with medicines, but medicines may be prescribed or recommended to relieve symptoms. Medicines may help:  Reduce your fever.  Reduce your cough.  Relieve nasal congestion. HOME CARE INSTRUCTIONS   Take medicines only as directed by your health care provider.   Gargle warm saltwater or take cough drops to comfort your throat as directed by your health care provider.  Use a warm mist humidifier or inhale steam from a shower to increase air moisture. This may make it easier to breathe.  Drink enough fluid to keep your urine clear or pale yellow.   Eat soups and other clear broths and maintain good nutrition.   Rest as needed.   Return to work when your temperature has returned to normal or as your health care provider advises. You may need to stay home longer to avoid infecting others. You can also use a face mask and careful hand washing to prevent spread of the virus.  Increase the usage of your inhaler if you have asthma.   Do not use any tobacco products, including cigarettes, chewing tobacco, or electronic cigarettes. If you need help quitting, ask your health care provider. PREVENTION  The best way to protect yourself from getting a cold is to practice good hygiene.   Avoid oral or hand contact with people with cold   symptoms.   Wash your hands often if contact occurs.  There is no clear evidence that vitamin C, vitamin E, echinacea, or exercise reduces the chance of developing a cold. However, it is always recommended to get plenty of rest, exercise, and practice good nutrition.  SEEK MEDICAL CARE IF:   You are getting worse rather than better.   Your symptoms are not controlled by medicine.   You have chills.  You have worsening shortness of breath.  You have brown or red mucus.  You have yellow or brown nasal  discharge.  You have pain in your face, especially when you bend forward.  You have a fever.  You have swollen neck glands.  You have pain while swallowing.  You have white areas in the back of your throat. SEEK IMMEDIATE MEDICAL CARE IF:   You have severe or persistent:  Headache.  Ear pain.  Sinus pain.  Chest pain.  You have chronic lung disease and any of the following:  Wheezing.  Prolonged cough.  Coughing up blood.  A change in your usual mucus.  You have a stiff neck.  You have changes in your:  Vision.  Hearing.  Thinking.  Mood. MAKE SURE YOU:   Understand these instructions.  Will watch your condition.  Will get help right away if you are not doing well or get worse.   This information is not intended to replace advice given to you by your health care provider. Make sure you discuss any questions you have with your health care provider.   Document Released: 04/01/2001 Document Revised: 02/20/2015 Document Reviewed: 01/11/2014 Elsevier Interactive Patient Education 2016 [employer]  

## 2015-12-16 NOTE — ED Notes (Signed)
Cough and congestion since Monday. Having non-prod coughs, states has had int low grade fevers. Denies any N/V/D.

## 2016-03-20 ENCOUNTER — Emergency Department (HOSPITAL_BASED_OUTPATIENT_CLINIC_OR_DEPARTMENT_OTHER)
Admission: EM | Admit: 2016-03-20 | Discharge: 2016-03-20 | Disposition: A | Discharge status: Home / Self Care | Payer: BLUE CROSS/BLUE SHIELD | Attending: Emergency Medicine | Admitting: Emergency Medicine

## 2016-03-20 ENCOUNTER — Encounter (HOSPITAL_BASED_OUTPATIENT_CLINIC_OR_DEPARTMENT_OTHER): Payer: Self-pay | Admitting: *Deleted

## 2016-03-20 DIAGNOSIS — R5383 Other fatigue: Secondary | ICD-10-CM | POA: Diagnosis present

## 2016-03-20 DIAGNOSIS — Z79899 Other long term (current) drug therapy: Secondary | ICD-10-CM | POA: Insufficient documentation

## 2016-03-20 DIAGNOSIS — Z87891 Personal history of nicotine dependence: Secondary | ICD-10-CM | POA: Diagnosis not present

## 2016-03-20 DIAGNOSIS — R55 Syncope and collapse: Secondary | ICD-10-CM | POA: Insufficient documentation

## 2016-03-20 HISTORY — DX: Anxiety disorder, unspecified: F41.9

## 2016-03-20 LAB — CBC WITH DIFFERENTIAL/PLATELET
Basophils Absolute: 0 10*3/uL (ref 0.0–0.1)
Basophils Relative: 0 %
Eosinophils Absolute: 0.1 10*3/uL (ref 0.0–0.7)
Eosinophils Relative: 1 %
HCT: 41.8 % (ref 39.0–52.0)
Hemoglobin: 14.3 g/dL (ref 13.0–17.0)
Lymphocytes Relative: 24 %
Lymphs Abs: 1.7 10*3/uL (ref 0.7–4.0)
MCH: 32.1 pg (ref 26.0–34.0)
MCHC: 34.2 g/dL (ref 30.0–36.0)
MCV: 93.7 fL (ref 78.0–100.0)
Monocytes Absolute: 0.8 10*3/uL (ref 0.1–1.0)
Monocytes Relative: 12 %
Neutro Abs: 4.4 10*3/uL (ref 1.7–7.7)
Neutrophils Relative %: 63 %
Platelets: 207 10*3/uL (ref 150–400)
RBC: 4.46 MIL/uL (ref 4.22–5.81)
RDW: 11.8 % (ref 11.5–15.5)
WBC: 7 10*3/uL (ref 4.0–10.5)

## 2016-03-20 LAB — BASIC METABOLIC PANEL
ANION GAP: 7 (ref 5–15)
BUN: 12 mg/dL (ref 6–20)
CALCIUM: 9.2 mg/dL (ref 8.9–10.3)
CO2: 27 mmol/L (ref 22–32)
CREATININE: 0.81 mg/dL (ref 0.61–1.24)
Chloride: 104 mmol/L (ref 101–111)
Glucose, Bld: 92 mg/dL (ref 65–99)
Potassium: 4 mmol/L (ref 3.5–5.1)
SODIUM: 138 mmol/L (ref 135–145)

## 2016-03-20 LAB — TROPONIN I: Troponin I: <0.03 ng/mL

## 2016-03-20 LAB — CBG MONITORING, ED: Glucose-Capillary: 88 mg/dL (ref 65–99)

## 2016-03-20 NOTE — ED Notes (Signed)
MD at bedside. 

## 2016-03-20 NOTE — ED Provider Notes (Signed)
CSN: [id number]     Arrival date & time 03/20/16  1434 History   First MD Initiated Contact with Patient 03/20/16 1527     Chief Complaint  Patient presents with  . Fatigue     (Consider location/radiation/quality/duration/timing/severity/associated sxs/prior Treatment) HPI Comments: Patient is a 30 year old male with history of anxiety. He presents for evaluation of weakness and near syncope. He reports standing in the kitchen at work when he began to feel lightheaded, weak, and had to sit down to get his balance. He reported just not feeling right. This sensation lasted for approximately 30 minutes, then resolved. He reports having his Lexapro discontinued last week, then started taking Effexor yesterday. He states that he now feels fine.  The history is provided by the patient.    Past Medical History  Diagnosis Date  . Anxiety    History reviewed. No pertinent past surgical history. No family history on file. Social History  Substance Use Topics  . Smoking status: Former Games developermoker  . Smokeless tobacco: None  . Alcohol Use: Yes     Comment: daily    Review of Systems  All other systems reviewed and are negative.     Allergies  Ativan and Penicillins  Home Medications   Prior to Admission medications   Medication Sig Start Date End Date Taking? Authorizing Provider  Venlafaxine HCl (EFFEXOR PO) Take by mouth.   Yes Historical Provider, MD   BP 130/93 mmHg  Pulse 64  Temp(Src) 97.8 F (36.6 C) (Oral)  Resp 18  Ht 5\' 11"  (1.803 m)  Wt 155 lb (70.308 kg)  BMI 21.63 kg/m2  SpO2 100% Physical Exam  Constitutional: He is oriented to person, place, and time. He appears well-developed and well-nourished. No distress.  HENT:  Head: Normocephalic and atraumatic.  Mouth/Throat: Oropharynx is clear and moist.  Eyes: EOM are normal. Pupils are equal, round, and reactive to light.  Neck: Normal range of motion. Neck supple.  Cardiovascular: Normal rate and regular rhythm.   Exam reveals no friction rub.   No murmur heard. Pulmonary/Chest: Effort normal and breath sounds normal. No respiratory distress. He has no wheezes. He has no rales.  Abdominal: Soft. Bowel sounds are normal. He exhibits no distension. There is no tenderness.  Musculoskeletal: Normal range of motion. He exhibits no edema.  Neurological: He is alert and oriented to person, place, and time. No cranial nerve deficit. He exhibits normal muscle tone. Coordination normal.  Skin: Skin is warm and dry. He is not diaphoretic.  Nursing note and vitals reviewed.   ED Course  Procedures (including critical care time) Labs Review Labs Reviewed  BASIC METABOLIC PANEL  CBC WITH DIFFERENTIAL/PLATELET  TROPONIN I  CBG MONITORING, ED    Imaging Review No results found. I have personally reviewed and evaluated these images and lab results as part of my medical decision-making.   EKG Interpretation   Date/Time:  Thursday March 20 2016 15:48:36 EDT Ventricular Rate:  56 PR Interval:  152 QRS Duration: 89 QT Interval:  398 QTC Calculation: 384 R Axis:   76 Text Interpretation:  Sinus rhythm Atrial premature complex Confirmed by  Breelle Hollywood  MD, Riley LamUGLAS ([id number]) on 03/20/2016 3:59:48 PM Also confirmed by Judd LienELO   MD, Alanta Scobey ([id number]), editor [place], Jola BabinskiMarilyn [phone]([id number])  on 03/20/2016 4:00:24  PM      MDM   Final diagnoses:  None    Patient presents with complaints of lightheadedness and not feeling well. He was started on Effexor yesterday and  his Lexapro was stopped one week ago. I suspect that this is the cause of his symptoms as his workup is otherwise unremarkable. He will be advised to continue his medications and return to ER if symptoms significantly worsen.    Geoffery Lyons, MD 03/20/16 [phone]

## 2016-03-20 NOTE — ED Notes (Signed)
Brought to ED via EMS. He was picked up at work with c.o fatigue. Hx of anxiety. He just started taking Effexor yesterday.

## 2016-03-20 NOTE — ED Notes (Signed)
Pt states he felt normal when he woke up this morning. As the day progressed pt states he began feeling lightheaded, generalized weakness, chest tightness and just "doesn't feel right". Pt denies N/V, denies pain, denies recent  Illness. Pt states he started taking a new medication, effexor, yesterday.

## 2016-03-20 NOTE — Discharge Instructions (Signed)
Continue your medications as prescribed.  Return to the ER if symptoms significantly worsen or change.   Near-Syncope Near-syncope (commonly known as near fainting) is sudden weakness, dizziness, or feeling like you might pass out. During an episode of near-syncope, you may also develop pale skin, have tunnel vision, or feel sick to your stomach (nauseous). Near-syncope may occur when getting up after sitting or while standing for a long time. It is caused by a sudden decrease in blood flow to the brain. This decrease can result from various causes or triggers, most of which are not serious. However, because near-syncope can sometimes be a sign of something serious, a medical evaluation is required. The specific cause is often not determined. HOME CARE INSTRUCTIONS  Monitor your condition for any changes. The following actions may help to alleviate any discomfort you are experiencing:  Have someone stay with you until you feel stable.  Lie down right away and prop your feet up if you start feeling like you might faint. Breathe deeply and steadily. Wait until all the symptoms have passed. Most of these episodes last only a few minutes. You may feel tired for several hours.   Drink enough fluids to keep your urine clear or pale yellow.   If you are taking blood pressure or heart medicine, get up slowly when seated or lying down. Take several minutes to sit and then stand. This can reduce dizziness.  Follow up with your health care provider as directed. SEEK IMMEDIATE MEDICAL CARE IF:   You have a severe headache.   You have unusual pain in the chest, abdomen, or back.   You are bleeding from the mouth or rectum, or you have black or tarry stool.   You have an irregular or very fast heartbeat.   You have repeated fainting or have seizure-like jerking during an episode.   You faint when sitting or lying down.   You have confusion.   You have difficulty walking.   You have  severe weakness.   You have vision problems.  MAKE SURE YOU:   Understand these instructions.  Will watch your condition.  Will get help right away if you are not doing well or get worse.   This information is not intended to replace advice given to you by your health care provider. Make sure you discuss any questions you have with your health care provider.   Document Released: 10/06/2005 Document Revised: 10/11/2013 Document Reviewed: 03/11/2013 Elsevier Interactive Patient Education [employer]2016 Elsevier Inc.

## 2016-03-20 NOTE — ED Notes (Signed)
Patient ambulates w/o difficulty, no c/o dizziness

## 2016-07-02 ENCOUNTER — Encounter: Payer: Self-pay | Admitting: Cardiology

## 2016-07-04 ENCOUNTER — Encounter: Payer: Self-pay | Admitting: *Deleted

## 2016-07-04 ENCOUNTER — Encounter: Payer: Self-pay | Admitting: Cardiology

## 2016-07-04 ENCOUNTER — Encounter (INDEPENDENT_AMBULATORY_CARE_PROVIDER_SITE_OTHER): Payer: Self-pay

## 2016-07-04 ENCOUNTER — Ambulatory Visit (INDEPENDENT_AMBULATORY_CARE_PROVIDER_SITE_OTHER): Payer: BLUE CROSS/BLUE SHIELD | Admitting: Cardiology

## 2016-07-04 VITALS — BP 110/84 | HR 54 | Ht 71.0 in | Wt 153.6 lb

## 2016-07-04 DIAGNOSIS — R42 Dizziness and giddiness: Secondary | ICD-10-CM | POA: Diagnosis not present

## 2016-07-04 NOTE — Progress Notes (Signed)
Electrophysiology Office Note   Date:  07/04/2016   ID:  Jose Mccullough, DOB 1986/08/23, MRN [id number]  PCP:  Farris HasMORROW, AARON, MD Primary Electrophysiologist:  Regan LemmingWill Martin Camnitz, MD    Chief Complaint  Patient presents with  . Advice Only    presyncope     History of Present Illness: Jose Mccullough is a 30 y.o. male who presents today for electrophysiology evaluation.   Presented to the ER with an episode of fatigue and near syncope.  Was standing at the kitchen at work when felt lightheaded, weak, and had to sit down.  Lasted 30 minutes.  Had to sit down.  Recently had his lexapro discontinued and started Effexor prior to the episode. Since that time, he has had multiple episodes of dizziness and near syncope. He says that he can be walking around and have episodes where he spaces out and feels quite dizzy. He says that he also notices that he has palpitations which occur most days. There are no exacerbating or alleviating factors of palpitations. He says that he feels like he is potentially a little bit more anxious, as he has been adjusting his anxiety medications.   Today, he denies symptoms of palpitations, chest pain, shortness of breath, orthopnea, PND, lower extremity edema, claudication, syncope, bleeding, or neurologic sequela. The patient is tolerating medications without difficulties and is otherwise without complaint today.    Past Medical History:  Diagnosis Date  . Anxiety    History reviewed. No pertinent surgical history.   No current outpatient prescriptions on file.   No current facility-administered medications for this visit.     Allergies:   Ativan [lorazepam] and Penicillins   Social History:  The patient  reports that he has quit smoking. He has never used smokeless tobacco. He reports that he drinks alcohol. He reports that he does not use drugs.   Family History:  The patient's family history includes Diabetes in his maternal grandmother.    ROS:   Please see the history of present illness.   Otherwise, review of systems is positive for palpitations, anxiety, dizziness.   All other systems are reviewed and negative.    PHYSICAL EXAM: VS:  BP 110/84   Pulse (!) 54   Ht 5\' 11"  (1.803 m)   Wt 153 lb 9.6 oz (69.7 kg)   BMI 21.42 kg/m  , BMI Body mass index is 21.42 kg/m. GEN: Well nourished, well developed, in no acute distress  HEENT: normal  Neck: no JVD, carotid bruits, or masses Cardiac: RRR; no murmurs, rubs, or gallops,no edema  Respiratory:  clear to auscultation bilaterally, normal work of breathing GI: soft, nontender, nondistended, + BS MS: no deformity or atrophy  Skin: warm and dry Neuro:  Strength and sensation are intact Psych: euthymic mood, full affect  EKG:  EKG is ordered today. Personal review of the ekg ordered shows sinus rhythm, rate 54  Recent Labs: 03/20/2016: BUN 12; Creatinine, Ser 0.81; Hemoglobin 14.3; Platelets 207; Potassium 4.0; Sodium 138    Lipid Panel  No results found for: CHOL, TRIG, HDL, CHOLHDL, VLDL, LDLCALC, LDLDIRECT   Wt Readings from Last 3 Encounters:  07/04/16 153 lb 9.6 oz (69.7 kg)  03/20/16 155 lb (70.3 kg)  12/16/15 160 lb (72.6 kg)      Other studies Reviewed: Additional studies/ records that were reviewed today include: ER notes   ASSESSMENT AND PLAN:  1.  Dizziness/Near Syncope: At this point, it is unclear what is causing his dizziness and  near syncope. It could be a combination of his psychiatric issues with his anxiety, he is also having palpitations, which could feelings of near syncope. We'll therefore put a 48 hour monitor his symptoms do occur roughly every 24-48 hours.    Current medicines are reviewed at length with the patient today.   The patient does not have concerns regarding his medicines.  The following changes were made today:  none  Labs/ tests ordered today include:  Orders Placed This Encounter  Procedures  . Holter monitor - 48 hour  .  EKG 12-Lead     Disposition:   FU with Will Camnitz pending monitor  Signed, Will Jorja Loa, MD  07/04/2016 9:21 AM     [facility] [address] [place] [address] [phone] (office) [phone] (fax)

## 2016-07-04 NOTE — Patient Instructions (Signed)
Medication Instructions:    Your physician recommends that you continue on your current medications as directed. Please refer to the Current Medication list given to you today.  --- If you need a refill on your cardiac medications before your next appointment, please call your pharmacy. ---  Labwork:  None ordered  Testing/Procedures: Your physician has recommended that you wear a 48 hour holter monitor. Holter monitors are medical devices that record the heart's electrical activity. Doctors most often use these monitors to diagnose arrhythmias. Arrhythmias are problems with the speed or rhythm of the heartbeat. The monitor is a small, portable device. You can wear one while you do your normal daily activities. This is usually used to diagnose what is causing palpitations/syncope (passing out).  Follow-Up:    We will call you with the results of your holter monitor and determine if follow is needed. At this time Dr. Elberta Fortisamnitz will see you on an as needed basis.  Thank you for choosing [facility]!!   Dory HornSherri Price, RN [phone]   Any Other Special Instructions Will Be Listed Below (If Applicable).   Holter Monitoring A Holter monitor is a small device that is used to detect abnormal heart rhythms. It clips to your clothing and is connected by wires to flat, sticky disks (electrodes) that attach to your chest. It is worn continuously for 24-48 hours. HOME CARE INSTRUCTIONS  Wear your Holter monitor at all times, even while exercising and sleeping, for as long as directed by your health care provider.  Make sure that the Holter monitor is safely clipped to your clothing or close to your body as recommended by your health care provider.  Do not get the monitor or wires wet.  Do not put body lotion or moisturizer on your chest.  Keep your skin clean.  Keep a diary of your daily activities, such as walking and doing chores. If you feel that your heartbeat is abnormal or that  your heart is fluttering or skipping a beat:  Record what you are doing when it happens.  Record what time of day the symptoms occur.  Return your Holter monitor as directed by your health care provider.  Keep all follow-up visits as directed by your health care provider. This is important. SEEK IMMEDIATE MEDICAL CARE IF:  You feel lightheaded or you faint.  You have trouble breathing.  You feel pain in your chest, upper arm, or jaw.  You feel sick to your stomach and your skin is pale, cool, or damp.  You heartbeat feels unusual or abnormal.   This information is not intended to replace advice given to you by your health care provider. Make sure you discuss any questions you have with your health care provider.   Document Released: 07/04/2004 Document Revised: 10/27/2014 Document Reviewed: 05/15/2014 Elsevier Interactive Patient Education [employer]2016 Elsevier Inc.

## 2016-07-09 ENCOUNTER — Ambulatory Visit (INDEPENDENT_AMBULATORY_CARE_PROVIDER_SITE_OTHER): Payer: BLUE CROSS/BLUE SHIELD

## 2016-07-09 DIAGNOSIS — R42 Dizziness and giddiness: Secondary | ICD-10-CM

## 2016-09-09 ENCOUNTER — Emergency Department (HOSPITAL_BASED_OUTPATIENT_CLINIC_OR_DEPARTMENT_OTHER)
Admission: EM | Admit: 2016-09-09 | Discharge: 2016-09-09 | Disposition: A | Discharge status: Home / Self Care | Payer: BLUE CROSS/BLUE SHIELD | Attending: Emergency Medicine | Admitting: Emergency Medicine

## 2016-09-09 ENCOUNTER — Encounter (HOSPITAL_BASED_OUTPATIENT_CLINIC_OR_DEPARTMENT_OTHER): Payer: Self-pay | Admitting: Emergency Medicine

## 2016-09-09 DIAGNOSIS — M6281 Muscle weakness (generalized): Secondary | ICD-10-CM | POA: Diagnosis not present

## 2016-09-09 DIAGNOSIS — F17200 Nicotine dependence, unspecified, uncomplicated: Secondary | ICD-10-CM | POA: Insufficient documentation

## 2016-09-09 DIAGNOSIS — R42 Dizziness and giddiness: Secondary | ICD-10-CM | POA: Insufficient documentation

## 2016-09-09 LAB — BASIC METABOLIC PANEL
Anion gap: 6 (ref 5–15)
BUN: 15 mg/dL (ref 6–20)
CHLORIDE: 105 mmol/L (ref 101–111)
CO2: 26 mmol/L (ref 22–32)
CREATININE: 0.98 mg/dL (ref 0.61–1.24)
Calcium: 8.8 mg/dL — ABNORMAL LOW (ref 8.9–10.3)
GFR calc Af Amer: >60 mL/min (ref >60)
GFR calc non Af Amer: >60 mL/min (ref >60)
Glucose, Bld: 99 mg/dL (ref 65–99)
Potassium: 4 mmol/L (ref 3.5–5.1)
SODIUM: 137 mmol/L (ref 135–145)

## 2016-09-09 LAB — CBC WITH DIFFERENTIAL/PLATELET
Basophils Absolute: 0 10*3/uL (ref 0.0–0.1)
Basophils Relative: 0 %
EOS ABS: 0.1 10*3/uL (ref 0.0–0.7)
EOS PCT: 1 %
HCT: 39.5 % (ref 39.0–52.0)
Hemoglobin: 13.3 g/dL (ref 13.0–17.0)
LYMPHS ABS: 1.9 10*3/uL (ref 0.7–4.0)
Lymphocytes Relative: 30 %
MCH: 31.4 pg (ref 26.0–34.0)
MCHC: 33.7 g/dL (ref 30.0–36.0)
MCV: 93.2 fL (ref 78.0–100.0)
MONOS PCT: 11 %
Monocytes Absolute: 0.7 10*3/uL (ref 0.1–1.0)
Neutro Abs: 3.7 10*3/uL (ref 1.7–7.7)
Neutrophils Relative %: 58 %
PLATELETS: 211 10*3/uL (ref 150–400)
RBC: 4.24 MIL/uL (ref 4.22–5.81)
RDW: 11.6 % (ref 11.5–15.5)
WBC: 6.4 10*3/uL (ref 4.0–10.5)

## 2016-09-09 NOTE — ED Triage Notes (Signed)
"  I've been feeling faint since the middle of last week", states this is an ongoing problem. Patient states he has been evaluated but has not been given an answer. "They just want to put me on a medicine, and I'm not about that".

## 2016-09-09 NOTE — Discharge Instructions (Signed)
You were seen in the ED today with generalized weakness. Your labs and EKG were normal here and we did not find an obvious cause of your symptoms. Return to the ED with any sudden weakness on one side of your body, chest pain, difficulty breathing, or other symptoms that are concerning for you.

## 2016-09-09 NOTE — ED Provider Notes (Signed)
Emergency Department Provider Note   I have reviewed the triage vital signs and the nursing notes.   By signing my name below, I, Jose Mccullough, attest that this documentation has been prepared under the direction and in the presence of Maia PlanJoshua G Alonia Dibuono, MD. Electronically Signed: Doreatha MartinEva Mccullough, ED Scribe. 09/09/16. 6:56 PM.     HISTORY  Chief Complaint Dizziness   HPI Jose Mccullough is a 30 y.o. male with h/o anxiety on Buspirone who presents to the Emergency Department complaining of moderate, ongoing episodes of lightheadedness for months and worsened over the last 1.5 weeks with associated generalized weakness. No worsening or alleviating factors noted. Pt states his episodes occur suddenly without warning and he has not been able to connect them to any activity. He does note some mild chest tightness and SOB during the episodes, but denies any otherwise.  He has recently been evaluated by Cardiology 07/04/16 and put on a Holter monitor with no significant findings. No changes in mediations over the last 1.5 weeks. Pt reports he was placed on Buspirone months ago for treatment of his anxiety after previous medications did not work for him. He is an occasional drinker. He denies drug use. Pt also denies abdominal pain, melena, dysuria, nausea, vomiting, diarrhea.    Past Medical History:  Diagnosis Date  . Anxiety     There are no active problems to display for this patient.   History reviewed. No pertinent surgical history.    Allergies Ativan [lorazepam] and Penicillins  Family History  Problem Relation Age of Onset  . Diabetes Maternal Grandmother     Social History Social History  Substance Use Topics  . Smoking status: Current Some Day Smoker  . Smokeless tobacco: Never Used  . Alcohol use Yes     Comment: daily    Review of Systems Constitutional: No fever/chills + generalized weakness Eyes: No visual changes. ENT: No sore throat. Cardiovascular: Denies chest  pain. Respiratory: Denies shortness of breath. Gastrointestinal: No abdominal pain.  No nausea, no vomiting.  No diarrhea.  No constipation. Genitourinary: Negative for dysuria. Musculoskeletal: Negative for back pain. Skin: Negative for rash. Neurological: + lightheadedness Negative for headaches, focal weakness or numbness.  10-point ROS otherwise negative.  ____________________________________________   PHYSICAL EXAM:  VITAL SIGNS: ED Triage Vitals [09/09/16 1744]  Enc Vitals Group     BP 123/81     Pulse Rate 64     Resp 18     Temp 98.1 F (36.7 C)     Temp Source Oral     SpO2 100 %     Weight 155 lb (70.3 kg)     Height 5\' 11"  (1.803 m)    Constitutional: Alert and oriented. Well appearing and in no acute distress. Eyes: Conjunctivae are normal.  Head: Atraumatic. Nose: No congestion/rhinnorhea. Mouth/Throat: Mucous membranes are moist.  Oropharynx non-erythematous. Neck: No stridor.  Cardiovascular: Normal rate, regular rhythm. Good peripheral circulation. Grossly normal heart sounds.   Respiratory: Normal respiratory effort.  No retractions. Lungs CTAB. Gastrointestinal: Soft and nontender. No distention.  Musculoskeletal: No lower extremity tenderness nor edema. No gross deformities of extremities. Neurologic:  Normal speech and language. No gross focal neurologic deficits are appreciated.  Skin:  Skin is warm, dry and intact. No rash noted.   ____________________________________________   LABS (all labs ordered are listed, but only abnormal results are displayed)  Labs Reviewed  BASIC METABOLIC PANEL - Abnormal; Notable for the following:  Result Value   Calcium 8.8 (*)    All other components within normal limits  CBC WITH DIFFERENTIAL/PLATELET   ____________________________________________  EKG   EKG Interpretation  Date/Time:  Tuesday September 09 2016 19:04:15 EST Ventricular Rate:  52 PR Interval:    QRS Duration: 89 QT  Interval:  398 QTC Calculation: 371 R Axis:   88 Text Interpretation:  Sinus rhythm ST elev, probable normal early repol pattern Baseline wander in lead(s) V4 V5 V6 No STEMI.  Similar to prior.  Confirmed by Raciel Caffrey MD, Jalexa Pifer [phone] on 09/09/2016 7:09:55 PM Also confirmed by Cieara Stierwalt MD, Amayia Ciano [phone]([id number]), editor [place]  CCT, BEVERLY ([id number])  on 09/10/2016 7:24:38 AM      ____________________________________________   PROCEDURES  Procedure(s) performed:   Procedures  None ____________________________________________   INITIAL IMPRESSION / ASSESSMENT AND PLAN / ED COURSE  Pertinent labs & imaging results that were available during my care of the patient were reviewed by me and considered in my medical decision making (see chart for details).  Patient presents to the ED with generalized weakness. He has had this in the past with no obvious medical cause. Reviewed Cardiology note from earlier in the year. The patient also weaned himself from anxiety medication > 1 month ago. EKG and labs unremarkable. Normal exam. Plan for PCP follow up. Discussed with the patient that anxiety/depression should also be considered in the scenario.  At this time, I do not feel there is any life-threatening condition present. I have reviewed and discussed all results (EKG, imaging, lab, urine as appropriate), exam findings with patient. I have reviewed nursing notes and appropriate previous records.  I feel the patient is safe to be discharged home without further emergent workup. Discussed usual and customary return precautions. Patient and family (if present) verbalize understanding and are comfortable with this plan.  Patient will follow-up with their primary care provider. If they do not have a primary care provider, information for follow-up has been provided to them. All questions have been answered.  ____________________________________________  FINAL CLINICAL IMPRESSION(S) / ED DIAGNOSES  Final  diagnoses:  Dizziness     MEDICATIONS GIVEN DURING THIS VISIT:  None  NEW OUTPATIENT MEDICATIONS STARTED DURING THIS VISIT:  None   Note:  This document was prepared using Dragon voice recognition software and may include unintentional dictation errors.  Alona BeneJoshua Solene Hereford, MD Emergency Medicine    I personally performed the services described in this documentation, which was scribed in my presence. The recorded information has been reviewed and is accurate.      Maia PlanJoshua G Stormy Sabol, MD 09/10/16 [phone]

## 2016-10-29 ENCOUNTER — Encounter: Payer: Self-pay | Admitting: Neurology

## 2016-10-29 ENCOUNTER — Ambulatory Visit (INDEPENDENT_AMBULATORY_CARE_PROVIDER_SITE_OTHER): Payer: BLUE CROSS/BLUE SHIELD | Admitting: Neurology

## 2016-10-29 VITALS — BP 118/70 | HR 81 | Ht 71.0 in | Wt 155.4 lb

## 2016-10-29 DIAGNOSIS — F411 Generalized anxiety disorder: Secondary | ICD-10-CM | POA: Diagnosis not present

## 2016-10-29 DIAGNOSIS — R55 Syncope and collapse: Secondary | ICD-10-CM | POA: Diagnosis not present

## 2016-10-29 NOTE — Progress Notes (Signed)
NEUROLOGY CONSULTATION NOTE  Jose Mccullough MRN: [id number] DOB: Feb 13, 1986  Referring provider: Dr. Farris Has Primary care provider: Dr. Farris Has  Reason for consult:  presyncope  Dear Dr Kateri Plummer:  Thank you for your kind referral of Jose Mccullough for consultation of the above symptoms. Although his history is well known to you, please allow me to reiterate it for the purpose of our medical record. Records and images were personally reviewed where available.  HISTORY OF PRESENT ILLNESS: This is a pleasant 31 year old right-handed man presenting for evaluation of a constant sensation of near syncope. He states symptoms started in 2008 or 2009, started becoming increasingly worse in 2011 but "completely full blown" for the past 3 years. He feels like he will pass out "everyday, all day." He feels like he is floating, like he is there but more "spacey." When walking, he feels unbalanced. He does not drive on the highway anymore. Symptoms would worsen at times to the point his whole body feels weak, his vision seems "more filmy" and his speech "would go a different route," becoming slurred. He can understand people around him but they are not as clear, he knows they are talking to him but he is not really listening. He tries to sit down and it only "halfway" makes him feel better. He has never completely lost consciousness. He states that he has been very anxious constantly since 2011, denies any history of anxiety as a child. The anxiety causes some difficulty with sleep initiation, but he has good sleep maintenance once asleep. 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 He has tried Lexapro, Effexor, and Buspar, with no change in symptoms. He only had side effects and stopped the medications. He denies any chest pain but has chest aches and tightness, he feels like he has irregular heart beats, "like something going on inside of me." He thinks his mother has similar  symptoms. He saw Cardiology in 06/2016 with normal EKG, sinus rhythm, rate 54. He had a 48-hour Holter monitor with sinus rhythm with sinus tachycardia, no tachy or brady arrhythmias or atrial fibrillation seen. He has a constant flushed feeling throughout his head, at times with sharp pains on the left temporal region. Memory is okay. He denies any olfactory/gustatory hallucinations, deja vu, rising epigastric sensation, focal numbness/tingling/weakness, myoclonic jerks. He had a normal birth and early development.  There is no history of febrile convulsions, CNS infections such as meningitis/encephalitis, significant traumatic brain injury, neurosurgical procedures, or family history of seizures.   PAST MEDICAL HISTORY: Past Medical History:  Diagnosis Date  . Anxiety     PAST SURGICAL HISTORY: No past surgical history on file.  MEDICATIONS: No current outpatient prescriptions on file prior to visit.   No current facility-administered medications on file prior to visit.     ALLERGIES: Allergies  Allergen Reactions  . Ativan [Lorazepam]     Caused depression  . Penicillins     CHILDHOOD    FAMILY HISTORY: Family History  Problem Relation Age of Onset  . Diabetes Maternal Grandmother     SOCIAL HISTORY: Social History   Social History  . Marital status: Single    Spouse name: N/A  . Number of children: N/A  . Years of education: N/A   Occupational History  . Not on file.   Social History Main Topics  . Smoking status: Current Some Day Smoker  . Smokeless tobacco: Never Used  . Alcohol use Yes  Comment: daily  . Drug use: No  . Sexual activity: Not on file   Other Topics Concern  . Not on file   Social History Narrative  . No narrative on file    REVIEW OF SYSTEMS: Constitutional: No fevers, chills, or sweats, no generalized fatigue, change in appetite Eyes: No visual changes, double vision, eye pain Ear, nose and throat: No hearing loss, ear pain, nasal  congestion, sore throat Cardiovascular: No chest pain, palpitations Respiratory:  No shortness of breath at rest or with exertion, wheezes GastrointestinaI: No nausea, vomiting, diarrhea, abdominal pain, fecal incontinence Genitourinary:  No dysuria, urinary retention or frequency Musculoskeletal:  No neck pain, back pain Integumentary: No rash, pruritus, skin lesions Neurological: as above Psychiatric: No depression, insomnia,+ anxiety Endocrine: No palpitations, fatigue, diaphoresis, mood swings, change in appetite, change in weight, increased thirst Hematologic/Lymphatic:  No anemia, purpura, petechiae. Allergic/Immunologic: no itchy/runny eyes, nasal congestion, recent allergic reactions, rashes  PHYSICAL EXAM: Vitals:   10/29/16 1258  BP: 118/70  Pulse: 81   General: No acute distress Head:  Normocephalic/atraumatic Eyes: Fundoscopic exam shows bilateral sharp discs, no vessel changes, exudates, or hemorrhages Neck: supple, no paraspinal tenderness, full range of motion Back: No paraspinal tenderness Heart: regular rate and rhythm Lungs: Clear to auscultation bilaterally. Vascular: No carotid bruits. Skin/Extremities: No rash, no edema Neurological Exam: Mental status: alert and oriented to person, place, and time, no dysarthria or aphasia, Fund of knowledge is appropriate.  Recent and remote memory are intact. 3/3 delayed recall. Attention and concentration are normal.    Able to name objects and repeat phrases. Cranial nerves: CN I: not tested CN II: pupils equal, round and reactive to light, visual fields intact, fundi unremarkable. CN III, IV, VI:  full range of motion, no nystagmus, no ptosis CN V: facial sensation intact CN VII: upper and lower face symmetric CN VIII: hearing intact to finger rub CN IX, X: gag intact, uvula midline CN XI: sternocleidomastoid and trapezius muscles intact CN XII: tongue midline Bulk & Tone: normal, no fasciculations. Motor: 5/5  throughout with no pronator drift. Sensation: intact to light touch, cold, pin, vibration and joint position sense.  No extinction to double simultaneous stimulation.  Romberg test negative Deep Tendon Reflexes: brisk +2 throughout, no ankle clonus Plantar responses: downgoing bilaterally Cerebellar: no incoordination on finger to nose, heel to shin. No dysdiadochokinesia Gait: narrow-based and steady, able to tandem walk adequately. Tremor: none  IMPRESSION: This is a pleasant 31 year old right-handed man presenting with symptoms of constant near syncope where he feels like he would pass out all day. He denies any full episode of loss of consciousness. Sometimes symptoms would worsen to the point his speech would get slurred and he feels spacey. Cardiology evaluation unremarkable. Symptoms likely due to anxiety, however neurological cause will be ruled out with MRI brain with and without contrast to assess for underlying structural abnormality. EEG will be ordered to assess for focal abnormalities that increase risk for seizures, although seizures are unlikely since anxiety is constant rather than episodic. He will be referred to Psychiatry. If test results are normal, he will follow-up on a prn basis and was encouraged to see Psychaitry and therapy.   Thank you for allowing me to participate in the care of this patient. Please do not hesitate to call for any questions or concerns.   Patrcia DollyKaren Aquino, M.D.  CC: Dr. Kateri PlummerMorrow

## 2016-10-29 NOTE — Patient Instructions (Signed)
1. Schedule MRI brain with and without contrast 2. Schedule 1-hour sleep-deprived EEG 3. Refer to Psychiatry and therapy for better treatment of anxiety 4. Our office will call your with results. If normal, follow-up on as needed basis

## 2016-10-30 ENCOUNTER — Ambulatory Visit (INDEPENDENT_AMBULATORY_CARE_PROVIDER_SITE_OTHER): Payer: BLUE CROSS/BLUE SHIELD | Admitting: Neurology

## 2016-10-30 DIAGNOSIS — R55 Syncope and collapse: Secondary | ICD-10-CM | POA: Diagnosis not present

## 2016-10-30 DIAGNOSIS — F411 Generalized anxiety disorder: Secondary | ICD-10-CM

## 2016-11-07 NOTE — Procedures (Signed)
ELECTROENCEPHALOGRAM REPORT  Date of Study: 10/30/2016  Patient's Name: Jose Mccullough MRN: [id number] Date of Birth: [birth year]  Referring Provider: Dr. Patrcia DollyKaren Dak Szumski  Clinical History: This is a 31 year old man with a sensation of constant near syncope where he feels he will pass out at all times. EEG for classification.  Medications: none  Technical Summary: A multichannel digital 1-hour sleep-deprived EEG recording measured by the international 10-20 system with electrodes applied with paste and impedances below 5000 ohms performed in our laboratory with EKG monitoring in an awake and asleep patient.  Hyperventilation and photic stimulation were performed.  The digital EEG was referentially recorded, reformatted, and digitally filtered in a variety of bipolar and referential montages for optimal display.    Description: The patient is awake and asleep during the recording.  During maximal wakefulness, there is a symmetric, medium voltage 11 Hz posterior dominant rhythm that attenuates with eye opening.  The record is symmetric.  During drowsiness and sleep, there is an increase in theta slowing of the background.  Vertex waves and symmetric sleep spindles were seen.  Hyperventilation and photic stimulation did not elicit any abnormalities.  There were no epileptiform discharges or electrographic seizures seen.    EKG lead showed sinus bradycardia at 66 bpm.  Impression: This 1-hour awake and asleep EEG is normal.    Clinical Correlation: A normal EEG does not exclude a clinical diagnosis of epilepsy. Clinical correlation is advised.   Patrcia DollyKaren Nadia Viar, M.D.

## 2016-11-14 ENCOUNTER — Telehealth: Payer: Self-pay

## 2016-11-14 NOTE — Telephone Encounter (Signed)
[facility] to get [facility] MRI results. Per Dr. Karel JarvisAquino results were sent to scan so [facility] needs to be contacted to recieve results. [facility] notified.

## 2017-01-06 ENCOUNTER — Ambulatory Visit (INDEPENDENT_AMBULATORY_CARE_PROVIDER_SITE_OTHER): Payer: Self-pay | Admitting: Psychiatry

## 2017-01-06 DIAGNOSIS — F410 Panic disorder [episodic paroxysmal anxiety] without agoraphobia: Secondary | ICD-10-CM

## 2017-01-06 DIAGNOSIS — Z87891 Personal history of nicotine dependence: Secondary | ICD-10-CM

## 2017-01-06 DIAGNOSIS — Z79899 Other long term (current) drug therapy: Secondary | ICD-10-CM

## 2017-01-06 MED ORDER — HYDROXYZINE PAMOATE 25 MG PO CAPS: 25.0000 mg | ORAL_CAPSULE | Freq: Three times a day (TID) | ORAL | 0 refills | Status: DC | PRN

## 2017-01-06 MED ORDER — FLUOXETINE HCL 10 MG PO CAPS: 10.0000 mg | ORAL_CAPSULE | Freq: Every day | ORAL | 1 refills | Status: DC

## 2017-01-06 NOTE — Patient Instructions (Signed)
Start Prozac (fluoxetine) 10 mg every day for 1 week, then increase to 20 mg (2 capsules) Prozac has been to taken every day to work  Use Vistaril 25 mg capsule if you have a episode of panic or anxiety and need help to calm down or relax  Come back and see me in 1 month

## 2017-01-06 NOTE — Progress Notes (Signed)
Psychiatric Initial Adult Assessment   Patient Identification: Jose Mccullough MRN:  [id number] Date of Evaluation:  01/06/2017 Referral Source: pcp Chief Complaint:   anxiety, panic Visit Diagnosis:    ICD-9-CM ICD-10-CM   1. Panic disorder 300.01 F41.0 FLUoxetine (PROZAC) 10 MG capsule     hydrOXYzine (VISTARIL) 25 MG capsule   History of Present Illness:  Jose Mccullough presents today for psychiatric intake assessment. He reports that he has been having significant episodes of panic and anxiety for the past 6-8 years. This is recently affected his work function, so he has decided to seek care from his primary care physician. Specifically he was worried about episodes of panic and anxiety that he is accompanied chest pain and shortness of breath with. He had a medical workup with cardiology and neurology which has been unremarkable.  The patient reports that he's not sure what triggered his anxiety when he was about 32-64 years old.  There were no immediate stressors that were going on at the time. He reports that his son was born around that time, and may be that contributed to his sense of anxiety. He reports that he is very close with his 12-year-old son and is now 32-monthold child. He reports that he is very close with his family and has excellent social support. He reports that the episodes of panic and anxiety worsened over the years, whereby he would become preoccupied with worrying about a possible panic or anxiety episode coming on throughout the day. This contributed to symptoms of depression and dysthymia over the years, and he has struggled with suicidal thoughts, as recently as a few months ago in the context of a panic episode.  He denies any current suicidal thoughts.  He has no psychiatric history prior to the panic episodes. He often feels anxious during the day, psychomotor tension, irritability during periods of increased anxiety, insomnia symptoms at night, and avoidance of leaving  the house unless he needs to for work or to visit his children. Regarding his psychiatric med trials, he was tried on Lexapro, BuSpar, Ativan. Ativan made him very very tired, BuSpar made him feel foggy headed, and Lexapro did not do anything for his symptoms.  We discussed the use of fluoxetine in conjunction with the Vistaril when necessary throughout the day. I educated patient on the risks and benefits of these therapies. I also discussed the prospects of him initiating individual therapy, and he is open to this, but has some apprehensions about therapy.   Associated Signs/Symptoms: Depression Symptoms:  anhedonia, insomnia, difficulty concentrating, anxiety, panic attacks, (Hypo) Manic Symptoms:  none Anxiety Symptoms:  Agoraphobia, Excessive Worry, Panic Symptoms, Psychotic Symptoms:  none PTSD Symptoms: Negative  Past Psychiatric History: Past psychiatric hospitalizations or previous medication treatment. This wProbation officeris the first psychiatrist that the patient has ever met are spoken with  Previous Psychotropic Medications: Yes   Substance Abuse History in the last 12 months:  No.  Consequences of Substance Abuse: Negative  Past Medical History:  Past Medical History:  Diagnosis Date  . Anxiety    No past surgical history on file.  Family Psychiatric History: No family psychiatric history  Family History:  Family History  Problem Relation Age of Onset  . Diabetes Maternal Grandmother     Social History:   Social History   Social History  . Marital status: Single    Spouse name: N/A  . Number of children: N/A  . Years of education: N/A   Social History Main Topics  .  Smoking status: Former Research scientist (life sciences)  . Smokeless tobacco: Never Used  . Alcohol use Yes     Comment: daily  . Drug use: No  . Sexual activity: Not on file   Other Topics Concern  . Not on file   Social History Narrative  . No narrative on file    Additional Social History: Currently works  at a call center for [employer]  Allergies:   Allergies  Allergen Reactions  . Ativan [Lorazepam]     Caused depression  . Penicillins     CHILDHOOD    Metabolic Disorder Labs: No results found for: HGBA1C, MPG No results found for: PROLACTIN No results found for: CHOL, TRIG, HDL, CHOLHDL, VLDL, LDLCALC   Current Medications: Current Outpatient Prescriptions  Medication Sig Dispense Refill  . FLUoxetine (PROZAC) 10 MG capsule Take 1 capsule (10 mg total) by mouth daily. 90 capsule 1  . hydrOXYzine (VISTARIL) 25 MG capsule Take 1 capsule (25 mg total) by mouth 3 (three) times daily as needed. 30 capsule 0   No current facility-administered medications for this visit.     Neurologic: Headache: Negative Seizure: Negative Paresthesias:Negative  Musculoskeletal: Strength & Muscle Tone: within normal limits Gait & Station: normal Patient leans: N/A  Psychiatric Specialty Exam: Review of Systems  Constitutional: Negative.   HENT: Negative.   Respiratory: Negative.   Cardiovascular: Negative.   Gastrointestinal: Negative.   Genitourinary: Negative.   Musculoskeletal: Negative.   Neurological: Negative.   Psychiatric/Behavioral: The patient is nervous/anxious and has insomnia.     There were no vitals taken for this visit.There is no height or weight on file to calculate BMI.  General Appearance: Casual and Fairly Groomed  Eye Contact:  Good  Speech:  Clear and Coherent  Volume:  Normal  Mood:  Anxious  Affect:  Congruent  Thought Process:  Goal Directed  Orientation:  Full (Time, Place, and Person)  Thought Content:  Logical  Suicidal Thoughts:  No  Homicidal Thoughts:  No  Memory:  Immediate;   Fair  Judgement:  Fair  Insight:  Fair  Psychomotor Activity:  Normal  Concentration:  Concentration: Fair  Recall:  NA  Fund of Knowledge:Fair  Language: Fair  Akathisia:  Negative  Handed:  Right  AIMS (if indicated):  n/a  Assets:  Communication Skills Desire  for Improvement Financial Resources/Insurance Housing Intimacy Leisure Time Physical Health Resilience Social Support Talents/Skills Transportation Vocational/Educational  ADL's:  Intact  Cognition: WNL  Sleep:  6-8 hours    Treatment Plan Summary: Sunny Gains is a 31 year old male with a recent psychiatric history consistent with panic disorder, with agoraphobia. He presents today for psychiatric medication assessment. He has also had some contributing depressive symptoms given his circumstances, and the effect that his anxiety has had on his work function. He generally describes himself to be an energetic and outgoing person, and feels more socially isolated and anxious than he typically has felt, as a result of being worried that he would have a panic episode in front of people. He has excellent family support. He is involved in his 88-year-old and one month old child lives.  He does not present with any acute safety issues. I believe that he would benefit from a higher potency SSRI, such as fluoxetine, with Vistaril as needed for breakthrough panic attacks. May consider gabapentin as an augmenting agent if he does not tolerate these therapies.  1. Panic disorder    - Initiate fluoxetine 10 mg daily, increase 20 mg daily in  1 week - Vistaril 25 mg every 8 hours as needed for anxiety or panic - Patient will think about whether or not he is ready to start therapy - Follow-up in 4-5 weeks  Aundra Dubin, MD 3/20/20183:46 PM

## 2017-02-09 ENCOUNTER — Ambulatory Visit (HOSPITAL_COMMUNITY): Payer: Self-pay | Admitting: Psychiatry

## 2017-04-13 ENCOUNTER — Other Ambulatory Visit (HOSPITAL_COMMUNITY): Payer: Self-pay

## 2017-04-13 DIAGNOSIS — F410 Panic disorder [episodic paroxysmal anxiety] without agoraphobia: Secondary | ICD-10-CM

## 2017-04-13 MED ORDER — FLUOXETINE HCL 20 MG PO CAPS: 20.0000 mg | ORAL_CAPSULE | Freq: Every day | ORAL | 0 refills | Status: DC

## 2017-04-24 ENCOUNTER — Ambulatory Visit (HOSPITAL_COMMUNITY): Payer: Self-pay | Admitting: Psychiatry

## 2017-05-11 ENCOUNTER — Other Ambulatory Visit (HOSPITAL_COMMUNITY): Payer: Self-pay

## 2017-05-11 MED ORDER — FLUOXETINE HCL 20 MG PO CAPS
20.0000 mg | ORAL_CAPSULE | Freq: Every day | ORAL | 0 refills | Status: DC
Start: 2017-05-11 — End: 2017-05-20

## 2017-05-20 ENCOUNTER — Encounter (HOSPITAL_COMMUNITY): Payer: Self-pay | Admitting: Psychiatry

## 2017-05-20 ENCOUNTER — Ambulatory Visit (INDEPENDENT_AMBULATORY_CARE_PROVIDER_SITE_OTHER): Payer: PRIVATE HEALTH INSURANCE | Admitting: Psychiatry

## 2017-05-20 VITALS — BP 126/72 | HR 71 | Ht 71.0 in | Wt 157.0 lb

## 2017-05-20 DIAGNOSIS — F3341 Major depressive disorder, recurrent, in partial remission: Secondary | ICD-10-CM | POA: Diagnosis not present

## 2017-05-20 DIAGNOSIS — Z87891 Personal history of nicotine dependence: Secondary | ICD-10-CM

## 2017-05-20 MED ORDER — FLUOXETINE HCL 40 MG PO CAPS: 40.0000 mg | ORAL_CAPSULE | Freq: Every day | ORAL | 1 refills | Status: DC

## 2017-05-20 NOTE — Progress Notes (Signed)
BH MD/PA/NP OP Progress Note  05/20/2017 3:02 PM Jose Mccullough  MRN:  [id number]  Chief Complaint:  doing better actually  Subjective:  Jose Mccullough presents today for medication management he reports that the Prozac 20 mg is been tolerated well, most significant side effect he's had has been related to delayed ejaculation. He reports that he has noticed his cheer is better, he tends to be less frustrated and less anxious in public places. He reports that he does continue to struggle with some social avoidance and avoidance of public places out of fear that he might have a panic attack. This has improved overall, as he is also sort of forcing himself to push through it. He reports that he has used Vistaril twice since he saw me, and that it was effective to help him calm down so he can go to sleep.    He has been thinking about therapy and decided he is ready to start individual therapy. I put in a referral for him to see Dr. Dellia CloudGutterman if available at [facility]. He is also agreeable to increase Prozac to 40 mg for further benefits in terms of reducing anxiety, rumination, panic, and helping with mood symptoms. He denies any acute safety issues or substance abuse. He drinks alcohol sparingly, generally 3 or 4 beers on the weekends.  Agrees to follow-up with Clinical research associatewriter in 3 months.  Visit Diagnosis:    ICD-10-CM   1. Recurrent major depressive disorder, in partial remission (HCC) F33.41 FLUoxetine (PROZAC) 40 MG capsule    Ambulatory referral to Psychology    Past Psychiatric History: See intake H&P for full details. Reviewed, with no updates at this time.   Past Medical History:  Past Medical History:  Diagnosis Date  . Anxiety    History reviewed. No pertinent surgical history.  Family Psychiatric History: See intake H&P for full details. Reviewed, with no updates at this time.   Family History:  Family History  Problem Relation Age of Onset  . Diabetes Maternal Grandmother      Social History:  Social History   Social History  . Marital status: Single    Spouse name: N/A  . Number of children: N/A  . Years of education: N/A   Social History Main Topics  . Smoking status: Former Games developermoker  . Smokeless tobacco: Never Used  . Alcohol use Yes     Comment: daily  . Drug use: No  . Sexual activity: Not Asked   Other Topics Concern  . None   Social History Narrative  . None    Allergies:  Allergies  Allergen Reactions  . Ativan [Lorazepam]     Caused depression  . Penicillins     CHILDHOOD    Metabolic Disorder Labs: No results found for: HGBA1C, MPG No results found for: PROLACTIN No results found for: CHOL, TRIG, HDL, CHOLHDL, VLDL, LDLCALC   Current Medications: Current Outpatient Prescriptions  Medication Sig Dispense Refill  . FLUoxetine (PROZAC) 40 MG capsule Take 1 capsule (40 mg total) by mouth daily. 90 capsule 1  . hydrOXYzine (VISTARIL) 25 MG capsule Take 1 capsule (25 mg total) by mouth 3 (three) times daily as needed. 30 capsule 0   No current facility-administered medications for this visit.     Neurologic: Headache: Negative Seizure: Negative Paresthesias: Negative  Musculoskeletal: Strength & Muscle Tone: within normal limits Gait & Station: normal Patient leans: N/A  Psychiatric Specialty Exam: ROS  Blood pressure 126/72, pulse 71, height 5\' 11"  (1.803  m), weight 157 lb (71.2 kg).Body mass index is 21.9 kg/m.  General Appearance: Casual and Well Groomed  Eye Contact:  Good  Speech:  Clear and Coherent  Volume:  Normal  Mood:  Euthymic  Affect:  Appropriate and Congruent  Thought Process:  Goal Directed  Orientation:  Full (Time, Place, and Person)  Thought Content: Logical   Suicidal Thoughts:  No  Homicidal Thoughts:  No  Memory:  Immediate;   Good  Judgement:  Good  Insight:  Good  Psychomotor Activity:  Normal  Concentration:  Concentration: Good  Recall:  Good  Fund of Knowledge: Good  Language:  Good  Akathisia:  Negative  Handed:  Right  AIMS (if indicated):  0  Assets:  Communication Skills Desire for Improvement Financial Resources/Insurance Housing Intimacy Leisure Time Physical Health Resilience Social Support Talents/Skills Transportation Vocational/Educational  ADL's:  Intact  Cognition: WNL  Sleep:  7-8 hours    Treatment Plan Summary: Jose Mccullough is a 31 year old male with 2 young children, ages 609 and almost 32 years old, single, works at [employer], who presents today for medication management for depression with anxious features and with panic. He has had a good response to Prozac, and would benefit from an interval increase in the dose for further management of his mood and anxiety symptoms. He does not present with any acute safety issues, and is agreeable to participate in individual therapy on an ongoing basis. I have increased his Prozac to 40 mg, and made a referral to psychology for individual therapy.  We will follow-up in 3 months.  1. Recurrent major depressive disorder, in partial remission (HCC)    Increase Prozac to 40 mg Vistaril as needed for anxiety or panic Return to clinic in 3 months Referral to psychology for individual therapy; my hope is that he will better be able to understand some of his anxieties and apprehensions, and gain further insight into driving factors for his mood symptoms  Burnard LeighAlexander Arya Mariafernanda Hendricksen, MD 05/20/2017, 3:02 PM

## 2017-08-20 ENCOUNTER — Ambulatory Visit (HOSPITAL_COMMUNITY): Payer: Self-pay | Admitting: Psychiatry

## 2017-08-21 ENCOUNTER — Encounter (HOSPITAL_COMMUNITY): Payer: Self-pay | Admitting: Psychiatry

## 2017-08-21 ENCOUNTER — Ambulatory Visit (INDEPENDENT_AMBULATORY_CARE_PROVIDER_SITE_OTHER): Payer: PRIVATE HEALTH INSURANCE | Admitting: Psychiatry

## 2017-08-21 VITALS — BP 116/80 | HR 83 | Ht 71.0 in | Wt 161.2 lb

## 2017-08-21 DIAGNOSIS — G4711 Idiopathic hypersomnia with long sleep time: Secondary | ICD-10-CM

## 2017-08-21 DIAGNOSIS — Z87891 Personal history of nicotine dependence: Secondary | ICD-10-CM

## 2017-08-21 DIAGNOSIS — F3341 Major depressive disorder, recurrent, in partial remission: Secondary | ICD-10-CM

## 2017-08-21 DIAGNOSIS — Z79899 Other long term (current) drug therapy: Secondary | ICD-10-CM | POA: Diagnosis not present

## 2017-08-21 NOTE — Patient Instructions (Signed)
Call the sleep clinic at [phone] ASAP to get the sleep studies done  Change prozac to night-time, right when you get into bed

## 2017-08-21 NOTE — Progress Notes (Signed)
BH MD/PA/NP OP Progress Note  08/21/2017 11:05 AM Jose Mccullough  MRN:  [id number]  Chief Complaint: med management  HPI: Jose Mccullough reports that he continues to struggle with the feeling of needing to pass out during the day.  I spent time with him exploring these symptoms in more detail.  We reviewed that he had seen cardiology, he had a sleep deprived EEG, he has had tilt table testing and all of these things have been normal.  He reports that his anxiety and mood symptoms are largely related to distress about these feelings he is having.  He reports that he does continue taking Prozac 40 mg daily but does not see it making any particular difference.  I spent time with him learning about some of his sleep symptoms.  He describes extremely vivid dreams fairly early in the evening, describes hypnagogic hallucinations, including a sensation of falling and hearing squeaky noises as he is falling asleep.  He reports that during periods of very emotional laughter or feeling very cheerful and excited, he can experience episodes of feeling extremely fatigued or having an increase sensation of almost passing out, and he feels like he needs to lay down.  He does not have any snoring, nor does he wake up with any daytime headaches.  He does tend to feel refreshed in the morning but this goes away fairly quickly.  He does not feel refreshed by naps.  He denies humanly any drug use or substance use.  He reports that he drinks alcohol approximately 3-5 drinks on the weekends with friends, during Friday through Sunday.  He reports that he is able to go weeks without drinking at times.  He reports that these symptoms as he describes started approximately around 2009-2010.  He reports that he was about mid 1720s when this began and he is continued to struggle with these, and frankly just wants to understand why he has these sensations.  I spent time educating him about my concerns of possible idiopathic hypersomnia  versus narcolepsy spectrum, and diagnostic assessment with MSLT and polysomnography.  I also suggested he switch Prozac to nighttime to help with some element of REM suppression.  Visit Diagnosis:    ICD-10-CM   1. Hypersomnia, idiopathic G47.11 Polysomnography 4 or more parameters    Multiple sleep latency test  2. Recurrent major depressive disorder, in partial remission (HCC) F33.41     Past Psychiatric History: See intake H&P for full details. Reviewed, with no updates at this time.   Past Medical History:  Past Medical History:  Diagnosis Date  . Anxiety    No past surgical history on file.  Family Psychiatric History: See intake H&P for full details. Reviewed, with no updates at this time.   Family History:  Family History  Problem Relation Age of Onset  . Diabetes Maternal Grandmother     Social History:  Social History   Social History  . Marital status: Single    Spouse name: N/A  . Number of children: N/A  . Years of education: N/A   Social History Main Topics  . Smoking status: Former Games developermoker  . Smokeless tobacco: Never Used  . Alcohol use Yes     Comment: daily  . Drug use: No  . Sexual activity: Not Asked   Other Topics Concern  . None   Social History Narrative  . None    Allergies:  Allergies  Allergen Reactions  . Ativan [Lorazepam]     Caused depression  .  Penicillins     CHILDHOOD    Metabolic Disorder Labs: No results found for: HGBA1C, MPG No results found for: PROLACTIN No results found for: CHOL, TRIG, HDL, CHOLHDL, VLDL, LDLCALC No results found for: TSH  Therapeutic Level Labs: No results found for: LITHIUM No results found for: VALPROATE No components found for:  CBMZ  Current Medications: Current Outpatient Prescriptions  Medication Sig Dispense Refill  . FLUoxetine (PROZAC) 40 MG capsule Take 1 capsule (40 mg total) by mouth daily. 90 capsule 1  . hydrOXYzine (VISTARIL) 25 MG capsule Take 1 capsule (25 mg total) by  mouth 3 (three) times daily as needed. 30 capsule 0   No current facility-administered medications for this visit.      Musculoskeletal: Strength & Muscle Tone: within normal limits Gait & Station: normal Patient leans: N/A  Psychiatric Specialty Exam: ROS  Blood pressure 116/80, pulse 83, height 5\' 11"  (1.803 m), weight 161 lb 3.2 oz (73.1 kg).Body mass index is 22.48 kg/m.  General Appearance: Casual and Fairly Groomed  Eye Contact:  Good  Speech:  Clear and Coherent  Volume:  Normal  Mood:  Euthymic  Affect:  Congruent  Thought Process:  Goal Directed and Descriptions of Associations: Intact  Orientation:  Full (Time, Place, and Person)  Thought Content: Logical   Suicidal Thoughts:  No  Homicidal Thoughts:  No  Memory:  Immediate;   Good  Judgement:  Good  Insight:  Good  Psychomotor Activity:  Normal  Concentration:  Concentration: Good  Recall:  Good  Fund of Knowledge: Good  Language: Good  Akathisia:  Negative  Handed:  Right  AIMS (if indicated): not done  Assets:  Communication Skills Desire for Improvement Financial Resources/Insurance Housing Intimacy Leisure Time Physical Health Resilience Social Support Talents/Skills Transportation Vocational/Educational  ADL's:  Intact  Cognition: WNL  Sleep:  Good   Screenings:   Assessment and Plan: Jose Mccullough is a 31 year old male with unspecific anxiety and mood symptoms, with partial remission of depressive symptoms with Prozac, who presents today for med management.  He continues to struggle with physical sensations, with cataplexy-like episodes, vivid dreams, hypnagogic hallucinations, and I am concerned about a possible sleep disorder contributing to his presentation.  I have recommended we proceed with MSLT and polysomnography and he is agreeable to those recommendations.  We will follow-up in 3 months and I will contact him after his sleep studies have been completed.  He may ultimately be an  appropriate candidate for Provigil.  1. Hypersomnia, idiopathic   2. Recurrent major depressive disorder, in partial remission (HCC)     Status of current problems: unchanged  Labs Ordered: Orders Placed This Encounter  Procedures  . Polysomnography 4 or more parameters    Standing Status:   Future    Standing Expiration Date:   08/21/2018    Order Specific Question:   Where should this test be performed:    Answer:   [facility]  . Multiple sleep latency test    Standing Status:   Future    Standing Expiration Date:   08/21/2018    Order Specific Question:   Where should this test be performed:    Answer:   [facility]    Labs Reviewed: Reviewed the sleep deprived EEG  Collateral Obtained/Records Reviewed: Reviewed the neurology notes, regarding emergency department assessment in January 2018  Plan:  Continue Prozac 40 mg, but switch to nighttime I am increasingly concerned about narcolepsy versus idiopathic hypersomnia,  as he describes episodes of possible cataplexy.  We will proceed with polysomnography and multiple sleep latency test RTC 3 months  I spent 30 minutes with the patient in direct face-to-face clinical care.  Greater than 50% of this time was spent in counseling and coordination of care with the patient.    Burnard Leigh, MD 08/21/2017, 11:05 AM

## 2017-10-06 ENCOUNTER — Ambulatory Visit (HOSPITAL_BASED_OUTPATIENT_CLINIC_OR_DEPARTMENT_OTHER): Payer: PRIVATE HEALTH INSURANCE | Attending: Psychiatry | Admitting: Internal Medicine

## 2017-10-06 VITALS — Ht 71.0 in | Wt 160.0 lb

## 2017-10-06 DIAGNOSIS — G4711 Idiopathic hypersomnia with long sleep time: Secondary | ICD-10-CM | POA: Diagnosis not present

## 2017-10-07 ENCOUNTER — Ambulatory Visit (HOSPITAL_BASED_OUTPATIENT_CLINIC_OR_DEPARTMENT_OTHER): Payer: No Typology Code available for payment source | Attending: Psychiatry | Admitting: Internal Medicine

## 2017-10-07 DIAGNOSIS — G4711 Idiopathic hypersomnia with long sleep time: Secondary | ICD-10-CM | POA: Diagnosis present

## 2017-10-18 DIAGNOSIS — G4711 Idiopathic hypersomnia with long sleep time: Secondary | ICD-10-CM | POA: Diagnosis not present

## 2017-10-18 NOTE — Procedures (Signed)
  Patient Name: Jose Mccullough, Lamberto Study Date: 10/06/2017 Gender: Male D.O.B: 1986/06/26 Age (years): 31 Referring Provider: Angus PalmsAlexander Eksir Height (inches): 71 Interpreting Physician: Jetty Duhamellinton Young MD, ABSM Weight (lbs): 160 RPSGT: Ulyess MortSpruill, Vicki BMI: 22 MRN: [id number] Neck Size: 14.00 <br> <br> CLINICAL INFORMATION Sleep Study Type: NPSG Indication for sleep study: Daytime Fatigue, Insomnia  Epworth Sleepiness Score: 7  SLEEP STUDY TECHNIQUE As per the AASM Manual for the Scoring of Sleep and Associated Events v2.3 (April 2016) with a hypopnea requiring 4% desaturations.  The channels recorded and monitored were frontal, central and occipital EEG, electrooculogram (EOG), submentalis EMG (chin), nasal and oral airflow, thoracic and abdominal wall motion, anterior tibialis EMG, snore microphone, electrocardiogram, and pulse oximetry.  MEDICATIONS Medications self-administered by patient taken the night of the study : PROZAC  SLEEP ARCHITECTURE The study was initiated at 10:33:21 PM and ended at 6:00:48 AM.  Sleep onset time was 109.2 minutes and the sleep efficiency was 71.2%. The total sleep time was 318.7 minutes.  Stage REM latency was 107.0 minutes.  The patient spent 8.63% of the night in stage N1 sleep, 69.25% in stage N2 sleep, 0.00% in stage N3 and 22.12% in REM.  Alpha intrusion was absent.  Supine sleep was 70.89%.  RESPIRATORY PARAMETERS The overall apnea/hypopnea index (AHI) was 1.9 per hour. There were 6 total apneas, including 3 obstructive, 3 central and 0 mixed apneas. There were 4 hypopneas and 14 RERAs.  The AHI during Stage REM sleep was 2.6 per hour.  AHI while supine was 1.6 per hour.  The mean oxygen saturation was 97.63%. The minimum SpO2 during sleep was 93.00%.  soft snoring was noted during this study.  CARDIAC DATA The 2 lead EKG demonstrated sinus rhythm. The mean heart rate was 61.49 beats per minute. Other EKG findings include:  None.  LEG MOVEMENT DATA The total PLMS were 8 with a resulting PLMS index of 1.51. Associated arousal with leg movement index was 0.2 .  IMPRESSIONS - No significant obstructive sleep apnea occurred during this study (AHI = 1.9/h). - No significant central sleep apnea occurred during this study (CAI = 0.6/h). - The patient had minimal or no oxygen desaturation during the study (Min O2 = 93.00%) - The patient snored with soft snoring volume. - No cardiac abnormalities were noted during this study. - Clinically significant periodic limb movements did not occur during sleep. No significant associated arousals. - Sleep onset was delayed- nonpecific in unfamiliar environment.  DIAGNOSIS - Normal study  RECOMMENDATIONS - Be careful with alcohol, sedatives and other CNS depressants that may worsen sleep apnea and disrupt normal sleep architecture. - Sleep hygiene should be reviewed to assess factors that may improve sleep quality. - Weight management and regular exercise should be initiated or continued if appropriate. - See result of MSLT done following this study.  [Electronically signed] 10/18/2017 10:16 AM  Jetty Duhamellinton Young MD, ABSM Diplomate, American Board of Sleep Medicine   NPI: [id number]                           Jetty Duhamellinton Young Diplomate, American Board of Sleep Medicine  ELECTRONICALLY SIGNED ON:  10/18/2017, 10:18 AM [facility] PH: [phone]   FX: [phone] ACCREDITED BY THE AMERICAN ACADEMY OF SLEEP MEDICINE

## 2017-10-23 NOTE — Procedures (Signed)
    Patient Name: Jose Mccullough, Sabas Study Date: 10/07/2017 Gender: Male D.O.B: 18-Oct-1986 Age (years): 31 Referring Provider: Angus PalmsAlexander Eksir Height (inches): 71 Interpreting Physician: Jetty Duhamellinton Stormey Wilborn MD, ABSM Weight (lbs): 160 RPSGT: Riley SinkBarksdale, Vernon BMI: 22 MRN: [id number] Neck Size: 14.00 <br> <br> CLINICAL INFORMATION Sleep Study Type: MSLT  The patient was referred to the sleep center for evaluation of daytime sleepiness.  Epworth Sleepiness Score: 7  SLEEP STUDY TECHNIQUE A Multiple Sleep Latency Test was performed after an overnight polysomnogram according to the AASM scoring manual v2.3 (April 2016) and clinical guidelines. Five nap opportunities occurred over the course of the test which followed an overnight polysomnogram. The channels recorded and monitored were frontal, central, and occipital electroencephalography (EEG), right and left electrooculogram (EOG), chin electromyography (EMG), and electrocardiogram (EKG).  MEDICATIONS Medications taken by the patient : PROZAC Medications administered by patient during sleep study : No sleep medicine administered.  IMPRESSIONS - Total number of naps attempted: 5.00 . Total number of naps with sleep attained: 5.00. The Mean Sleep Latency was 05:30 minutes. There were 2.00 sleep-onset REM periods. - The patient appears to have pathologic sleepiness, evidenced by a short mean sleep latency (8 minutes or less) on this MSLT. - The patient had taken Prozac before the preceding NPSG, which is against protocol because antidepressants may suppress REM. However increased REM pressure was still evident with REM in 2 naps. - Findings are consistent with Narcolepsy in the right clinical context.  DIAGNOSIS - Narcolepsy  RECOMMENDATIONS - Manage as Narcolepsy if appropriate based on clinical judgment.  [Electronically signed] 10/18/2017 10:35 AM  Jetty Duhamellinton Vickey Ewbank MD, ABSM Diplomate, American Board of Sleep Medicine   NPI:  [id number]                         Jetty Duhamellinton Briget Shaheed Diplomate, American Board of Sleep Medicine  ELECTRONICALLY SIGNED ON:  10/23/2017, 2:47 PM [facility] PH: [phone]   FX: [phone] ACCREDITED BY THE AMERICAN ACADEMY OF SLEEP MEDICINE

## 2017-11-02 NOTE — Progress Notes (Signed)
Called patient, left VM to discuss results. Return number left and encouraged him to activate mychart for communication as well

## 2017-11-04 ENCOUNTER — Encounter (HOSPITAL_COMMUNITY): Payer: Self-pay | Admitting: Psychiatry

## 2017-11-04 ENCOUNTER — Other Ambulatory Visit (HOSPITAL_COMMUNITY): Payer: Self-pay | Admitting: Psychiatry

## 2017-11-04 MED ORDER — MODAFINIL 100 MG PO TABS: 100.0000 mg | ORAL_TABLET | Freq: Every day | ORAL | 2 refills | Status: DC

## 2017-11-17 ENCOUNTER — Encounter (HOSPITAL_COMMUNITY): Payer: Self-pay | Admitting: Psychiatry

## 2017-11-17 ENCOUNTER — Ambulatory Visit (INDEPENDENT_AMBULATORY_CARE_PROVIDER_SITE_OTHER): Payer: PRIVATE HEALTH INSURANCE | Admitting: Psychiatry

## 2017-11-17 VITALS — BP 126/74 | HR 75 | Ht 71.0 in | Wt 179.6 lb

## 2017-11-17 DIAGNOSIS — F3341 Major depressive disorder, recurrent, in partial remission: Secondary | ICD-10-CM

## 2017-11-17 DIAGNOSIS — Z79899 Other long term (current) drug therapy: Secondary | ICD-10-CM | POA: Diagnosis not present

## 2017-11-17 DIAGNOSIS — G47411 Narcolepsy with cataplexy: Secondary | ICD-10-CM

## 2017-11-17 DIAGNOSIS — R5382 Chronic fatigue, unspecified: Secondary | ICD-10-CM

## 2017-11-17 DIAGNOSIS — G47419 Narcolepsy without cataplexy: Secondary | ICD-10-CM

## 2017-11-17 DIAGNOSIS — Z87891 Personal history of nicotine dependence: Secondary | ICD-10-CM

## 2017-11-17 MED ORDER — MODAFINIL 100 MG PO TABS: 100.0000 mg | ORAL_TABLET | Freq: Every day | ORAL | 0 refills | Status: DC

## 2017-11-17 NOTE — Progress Notes (Signed)
BH MD/PA/NP OP Progress Note  11/17/2017 2:39 PM Mayo AoMichael Mccullough  MRN:  [id number]  Chief Complaint:  Sleep study follow-up  HPI: Patient presents today for follow-up of MS LT and polysomnography.  I spent ample time educating him about narcolepsy, and reviewing the concrete data evidence on his polysomnography, and pairing this with his clinical presentation of chronic fatigue, and symptoms of cataplexy, and/or presyncope.  He has read about the illness and does see some of the signs and symptoms present.  I also spent time reviewing his sleep onset hallucinations, vivid dreams, and tends to be quite active in his sleep as he has been told by romantic partners.  He tends to dream very quickly as soon as he falls asleep.  I spent time educating him on modafinil, he has yet to start it but due to issues with the prior authorization.  He is going [facility] given that it is $40 there is, and get subsequent fills through his insurance.  We reviewed the risks and benefits including the potential for anxiety, palpitations, psychosis, sweating.  I educated him on the modality of action.  I also educated him on medication such as Xyrem used for narcolepsy but, he wishes to hold off on such interventions and first see how things go with Provigil.  I agreed with this plan, we will continue Prozac.  He denies any acute changes in his mental status or mood.  Denies any safety concerns.  Agrees to proceed as discussed and follow-up in 8 weeks.  Visit Diagnosis:    ICD-10-CM   1. Primary narcolepsy without cataplexy G47.419 modafinil (PROVIGIL) 100 MG tablet  2. Recurrent major depressive disorder, in partial remission (HCC) F33.41     Past Psychiatric History: See intake H&P for full details. Reviewed, with no updates at this time.   Past Medical History:  Past Medical History:  Diagnosis Date  . Anxiety    No past surgical history on file.  Family Psychiatric History: See  intake H&P for full details. Reviewed, with no updates at this time.   Family History:  Family History  Problem Relation Age of Onset  . Diabetes Maternal Grandmother     Social History:  Social History   Socioeconomic History  . Marital status: Single    Spouse name: Not on file  . Number of children: Not on file  . Years of education: Not on file  . Highest education level: Not on file  Social Needs  . Financial resource strain: Not on file  . Food insecurity - worry: Not on file  . Food insecurity - inability: Not on file  . Transportation needs - medical: Not on file  . Transportation needs - non-medical: Not on file  Occupational History  . Not on file  Tobacco Use  . Smoking status: Former Games developermoker  . Smokeless tobacco: Never Used  Substance and Sexual Activity  . Alcohol use: Yes    Comment: daily  . Drug use: No  . Sexual activity: Not on file  Other Topics Concern  . Not on file  Social History Narrative  . Not on file    Allergies:  Allergies  Allergen Reactions  . Ativan [Lorazepam]     Caused depression  . Penicillins     CHILDHOOD    Metabolic Disorder Labs: No results found for: HGBA1C, MPG No results found for: PROLACTIN No results found for: CHOL, TRIG, HDL, CHOLHDL, VLDL, LDLCALC No results found  for: TSH  Therapeutic Level Labs: No results found for: LITHIUM No results found for: VALPROATE No components found for:  CBMZ  Current Medications: Current Outpatient Medications  Medication Sig Dispense Refill  . FLUoxetine (PROZAC) 40 MG capsule Take 1 capsule (40 mg total) by mouth daily. 90 capsule 1  . hydrOXYzine (VISTARIL) 25 MG capsule Take 1 capsule (25 mg total) by mouth 3 (three) times daily as needed. 30 capsule 0  . modafinil (PROVIGIL) 100 MG tablet Take 1 tablet (100 mg total) by mouth daily. Start 1/2 tablet daily for 1 week, then increase to 1 tablet 30 tablet 0   No current facility-administered medications for this visit.       Musculoskeletal: Strength & Muscle Tone: within normal limits Gait & Station: normal Patient leans: N/A  Psychiatric Specialty Exam: ROS  There were no vitals taken for this visit.There is no height or weight on file to calculate BMI.  General Appearance: Casual and tired  Eye Contact:  Good  Speech:  Clear and Coherent and Normal Rate  Volume:  Normal  Mood:  Dysphoric  Affect:  Congruent  Thought Process:  Coherent, Goal Directed and Descriptions of Associations: Intact  Orientation:  Full (Time, Place, and Person)  Thought Content: Logical   Suicidal Thoughts:  No  Homicidal Thoughts:  No  Memory:  Immediate;   Good  Judgement:  Fair  Insight:  Fair  Psychomotor Activity:  Normal  Concentration:  Concentration: Good  Recall:  Good  Fund of Knowledge: Good  Language: Good  Akathisia:  Negative  Handed:  Right  AIMS (if indicated): not done  Assets:  Communication Skills Desire for Improvement Financial Resources/Insurance Housing Intimacy Leisure Time Physical Health Resilience Social Support Talents/Skills Transportation Vocational/Educational  ADL's:  Intact  Cognition: WNL  Sleep:  Fair   Screenings:   Assessment and Plan:  Jose Mccullough is a 32 year old male who presents today for med management follow-up.  He has had partial remission of his depressive symptoms, which I believe have been complicated by undiagnosed sleep disorder until recently.  He is completed polysomnography and MS LT which are suggestive of narcolepsy.  We have agreed to proceed as below with the addition of Provigil and follow-up in 8 weeks or sooner if needed.  I reviewed the risks and benefits of stimulant medications, including habit forming, potential increase in anxiety, potential palpitations, sweating, psychosis, headaches, seizures.  1. Primary narcolepsy without cataplexy   2. Recurrent major depressive disorder, in partial remission (HCC)     Status of current  problems: unchanged  Labs Ordered: No orders of the defined types were placed in this encounter.   Labs Reviewed: reviewed MSTL and PSG  Collateral Obtained/Records Reviewed: na  Plan:  Continue Prozac 40 mg nightly Initiate Provigil 100 mg daily Educated patient on narcolepsy, and on Xyrem Return to clinic in 8 weeks  I spent 30 minutes with the patient in direct face-to-face clinical care.  Greater than 50% of this time was spent in counseling and coordination of care with the patient.    Burnard Leigh, MD 11/17/2017, 2:39 PM

## 2017-12-25 ENCOUNTER — Other Ambulatory Visit (HOSPITAL_COMMUNITY): Payer: Self-pay | Admitting: Psychiatry

## 2017-12-25 DIAGNOSIS — G47419 Narcolepsy without cataplexy: Secondary | ICD-10-CM

## 2018-01-20 ENCOUNTER — Ambulatory Visit (HOSPITAL_COMMUNITY): Payer: PRIVATE HEALTH INSURANCE | Admitting: Psychiatry

## 2018-02-23 ENCOUNTER — Encounter (HOSPITAL_COMMUNITY): Payer: Self-pay | Admitting: Psychiatry

## 2018-02-23 DIAGNOSIS — G47419 Narcolepsy without cataplexy: Secondary | ICD-10-CM

## 2018-03-01 ENCOUNTER — Other Ambulatory Visit (HOSPITAL_COMMUNITY): Payer: Self-pay | Admitting: Psychiatry

## 2018-03-01 DIAGNOSIS — G47419 Narcolepsy without cataplexy: Secondary | ICD-10-CM

## 2018-03-02 ENCOUNTER — Telehealth (HOSPITAL_COMMUNITY): Payer: Self-pay

## 2018-03-02 MED ORDER — MODAFINIL 100 MG PO TABS: 100.0000 mg | ORAL_TABLET | Freq: Every day | ORAL | 0 refills | Status: DC

## 2018-03-02 NOTE — Telephone Encounter (Signed)
Yes that is fine, I will send this now

## 2018-03-02 NOTE — Telephone Encounter (Signed)
Thanks Dr. Rene Kocher.

## 2018-03-02 NOTE — Telephone Encounter (Signed)
Received a fax refill request for Provigil  tabs. Next appointment is scheduled for 03-04-18. Please advise

## 2018-03-03 MED ORDER — MODAFINIL 100 MG PO TABS: 100.0000 mg | ORAL_TABLET | Freq: Every day | ORAL | 0 refills | Status: DC

## 2018-03-03 NOTE — Addendum Note (Signed)
Addended by: Angus Palms A on: 03/03/2018 12:58 PM   Modules accepted: Orders

## 2018-03-04 ENCOUNTER — Encounter (HOSPITAL_COMMUNITY): Payer: Self-pay | Admitting: Psychiatry

## 2018-03-04 ENCOUNTER — Ambulatory Visit (INDEPENDENT_AMBULATORY_CARE_PROVIDER_SITE_OTHER): Payer: PRIVATE HEALTH INSURANCE | Admitting: Psychiatry

## 2018-03-04 VITALS — BP 122/70 | HR 69 | Ht 71.0 in | Wt 180.8 lb

## 2018-03-04 DIAGNOSIS — Z79899 Other long term (current) drug therapy: Secondary | ICD-10-CM | POA: Diagnosis not present

## 2018-03-04 DIAGNOSIS — Z87891 Personal history of nicotine dependence: Secondary | ICD-10-CM | POA: Diagnosis not present

## 2018-03-04 DIAGNOSIS — G4711 Idiopathic hypersomnia with long sleep time: Secondary | ICD-10-CM | POA: Diagnosis not present

## 2018-03-04 DIAGNOSIS — G47419 Narcolepsy without cataplexy: Secondary | ICD-10-CM

## 2018-03-04 MED ORDER — AMPHETAMINE-DEXTROAMPHETAMINE 10 MG PO TABS: 10.0000 mg | ORAL_TABLET | Freq: Two times a day (BID) | ORAL | 0 refills | Status: DC

## 2018-03-04 NOTE — Progress Notes (Signed)
BH MD/PA/NP OP Progress Note  03/04/2018 3:04 PM Jose Mccullough  MRN:  [id number]  Chief Complaint: med management  HPI: Jose Mccullough is no longer taking Prozac, has been off of it for 2 weeks and feels like his mood and anxiety are doing fine.  He is taking the Provigil 100 mg and felt like it was providing benefit at first.  We agreed to increase to 200 mg daily, and as a backup plan if he continues to struggle with the feeling of fatigue and falling out, we agreed to initiate Adderall 10 mg twice a day.  He reports that he wants to be on the fewest number of medicines possible, so if Provigil 200 mg is helpful, he will not fill the Adderall dose.  Alternatively if the Provigil 200 mg does not provide him benefit, we agreed to do a trial of Adderall for narcolepsy and hypersomnia symptoms.   He is sleeping well at night, reports that his appetite and mood have been good.  Things are going well with his work, and he recently got a new job after an unexpected layoffs at his prior job.  Visit Diagnosis:    ICD-10-CM   1. Primary narcolepsy without cataplexy G47.419 amphetamine-dextroamphetamine (ADDERALL) 10 MG tablet  2. Hypersomnia, idiopathic G47.11 amphetamine-dextroamphetamine (ADDERALL) 10 MG tablet    Past Psychiatric History: See intake H&P for full details. Reviewed, with no updates at this time.   Past Medical History:  Past Medical History:  Diagnosis Date  . Anxiety    History reviewed. No pertinent surgical history.  Family Psychiatric History: See intake H&P for full details. Reviewed, with no updates at this time.   Family History:  Family History  Problem Relation Age of Onset  . Diabetes Maternal Grandmother     Social History:  Social History   Socioeconomic History  . Marital status: Single    Spouse name: Not on file  . Number of children: Not on file  . Years of education: Not on file  . Highest education level: Not on file  Occupational History  . Not  on file  Social Needs  . Financial resource strain: Not on file  . Food insecurity:    Worry: Not on file    Inability: Not on file  . Transportation needs:    Medical: Not on file    Non-medical: Not on file  Tobacco Use  . Smoking status: Former Games developer  . Smokeless tobacco: Never Used  Substance and Sexual Activity  . Alcohol use: Yes    Comment: occasional  . Drug use: No  . Sexual activity: Not on file  Lifestyle  . Physical activity:    Days per week: Not on file    Minutes per session: Not on file  . Stress: Not on file  Relationships  . Social connections:    Talks on phone: Not on file    Gets together: Not on file    Attends religious service: Not on file    Active member of club or organization: Not on file    Attends meetings of clubs or organizations: Not on file    Relationship status: Not on file  Other Topics Concern  . Not on file  Social History Narrative  . Not on file    Allergies:  Allergies  Allergen Reactions  . Ativan [Lorazepam]     Caused depression  . Penicillins     CHILDHOOD    Metabolic Disorder Labs: No results found for:  HGBA1C, MPG No results found for: PROLACTIN No results found for: CHOL, TRIG, HDL, CHOLHDL, VLDL, LDLCALC No results found for: TSH  Therapeutic Level Labs: No results found for: LITHIUM No results found for: VALPROATE No components found for:  CBMZ  Current Medications: Current Outpatient Medications  Medication Sig Dispense Refill  . modafinil (PROVIGIL) 100 MG tablet Take 1 tablet (100 mg total) by mouth daily. 30 tablet 0  . amphetamine-dextroamphetamine (ADDERALL) 10 MG tablet Take 1 tablet (10 mg total) by mouth 2 (two) times daily with a meal. 60 tablet 0   No current facility-administered medications for this visit.      Musculoskeletal: Strength & Muscle Tone: within normal limits Gait & Station: normal Patient leans: N/A  Psychiatric Specialty Exam: ROS  Blood pressure 122/70, pulse 69,  height  (1.803 m), weight 180 lb 12.8 oz (82 kg).Body mass index is 25.22 kg/m.  General Appearance: Casual and Well Groomed  Eye Contact:  Good  Speech:  Clear and Coherent and Normal Rate  Volume:  Normal  Mood:  Euthymic  Affect:  Congruent  Thought Process:  Goal Directed and Descriptions of Associations: Intact  Orientation:  Full (Time, Place, and Person)  Thought Content: Logical   Suicidal Thoughts:  No  Homicidal Thoughts:  No  Memory:  Immediate;   Good  Judgement:  Good  Insight:  Fair  Psychomotor Activity:  Normal  Concentration:  Concentration: Good  Recall:  Good  Fund of Knowledge: Good  Language: Good  Akathisia:  Negative  Handed:  Right  AIMS (if indicated): not done  Assets:  Communication Skills Desire for Improvement Housing Physical Health Talents/Skills Transportation Vocational/Educational  ADL's:  Intact  Cognition: WNL  Sleep:  Fair   Screenings:   Assessment and Plan: Maxen Rowland presents with good tolerability of Provigil, would benefit from an increase to 200 mg daily.  We have agreed to initiate Adderall 10 mg twice a day in the case that Provigil does not adequately address his symptoms.  He denies any acute concerns about his mood or anxiety and reports that he has been off of Prozac for the past 2 weeks without any significant noticeable negative effects.  He feels like his mood has been good, sleep is good.  We will follow-up in 2 months or sooner if needed.  1. Primary narcolepsy without cataplexy   2. Hypersomnia, idiopathic     Status of current problems: stable  Labs Ordered: No orders of the defined types were placed in this encounter.   Labs Reviewed: na  Collateral Obtained/Records Reviewed: [place] controlled substance database  Plan:  Increase Provigil to 200 mg daily; patient recently picked up to 100 mg tablet and will simply take 2 over the next 2 weeks and let writer know how this is going Adderall  10 mg twice a day, I sent this prescription and so that we can initiate as a backup if Provigil is inadequate in addressing his symptoms  I spent 20 minutes with the patient in direct face-to-face clinical care.  Greater than 50% of this time was spent in counseling and coordination of care with the patient.    Burnard Leigh, MD 03/04/2018, 3:04 PM

## 2018-03-18 ENCOUNTER — Encounter (HOSPITAL_COMMUNITY): Payer: Self-pay | Admitting: Psychiatry

## 2018-03-25 ENCOUNTER — Encounter (HOSPITAL_COMMUNITY): Payer: Self-pay | Admitting: Psychiatry

## 2018-03-25 NOTE — Telephone Encounter (Signed)
Do you know what this is about? 

## 2018-04-06 NOTE — Telephone Encounter (Signed)
Its probably in the shredder, he just needs to send it via fax

## 2018-04-15 ENCOUNTER — Encounter (HOSPITAL_COMMUNITY): Payer: Self-pay | Admitting: Psychiatry

## 2018-04-16 ENCOUNTER — Encounter (HOSPITAL_COMMUNITY): Payer: Self-pay | Admitting: Psychiatry

## 2018-04-17 NOTE — Telephone Encounter (Signed)
I never got anything on this, not sure he ever dropped it off or faxed it again

## 2018-04-21 ENCOUNTER — Other Ambulatory Visit (HOSPITAL_COMMUNITY): Payer: Self-pay | Admitting: Psychiatry

## 2018-04-21 DIAGNOSIS — G47419 Narcolepsy without cataplexy: Secondary | ICD-10-CM

## 2018-04-21 DIAGNOSIS — G4711 Idiopathic hypersomnia with long sleep time: Secondary | ICD-10-CM

## 2018-04-21 MED ORDER — AMPHETAMINE-DEXTROAMPHETAMINE 10 MG PO TABS: 10.0000 mg | ORAL_TABLET | Freq: Two times a day (BID) | ORAL | 0 refills | Status: DC

## 2018-05-05 ENCOUNTER — Ambulatory Visit (INDEPENDENT_AMBULATORY_CARE_PROVIDER_SITE_OTHER): Payer: PRIVATE HEALTH INSURANCE | Admitting: Psychiatry

## 2018-05-05 ENCOUNTER — Encounter (HOSPITAL_COMMUNITY): Payer: Self-pay | Admitting: Psychiatry

## 2018-05-05 DIAGNOSIS — G4711 Idiopathic hypersomnia with long sleep time: Secondary | ICD-10-CM | POA: Diagnosis not present

## 2018-05-05 NOTE — Progress Notes (Signed)
BH MD/PA/NP OP Progress Note  05/05/2018 3:04 PM Jose Mccullough  MRN:  [id number]  Chief Complaint: med management  HPI: Jose Mccullough is no longer taking any medications.  He wants to work on natural interventions, vitamin supplements, exercise, and healthy eating.  We agreed to follow-up as needed, and he was receptive to this.  No other acute concerns at this time, and he is appreciative for the medication trials but feels like medications do not agree with his body.  Visit Diagnosis:    ICD-10-CM   1. Hypersomnia, idiopathic G47.11     Past Psychiatric History: See intake H&P for full details. Reviewed, with no updates at this time.   Past Medical History:  Past Medical History:  Diagnosis Date  . Anxiety    No past surgical history on file.  Family Psychiatric History: See intake H&P for full details. Reviewed, with no updates at this time.   Family History:  Family History  Problem Relation Age of Onset  . Diabetes Maternal Grandmother     Social History:  Social History   Socioeconomic History  . Marital status: Single    Spouse name: Not on file  . Number of children: Not on file  . Years of education: Not on file  . Highest education level: Not on file  Occupational History  . Not on file  Social Needs  . Financial resource strain: Not on file  . Food insecurity:    Worry: Not on file    Inability: Not on file  . Transportation needs:    Medical: Not on file    Non-medical: Not on file  Tobacco Use  . Smoking status: Former Games developermoker  . Smokeless tobacco: Never Used  Substance and Sexual Activity  . Alcohol use: Yes    Comment: occasional  . Drug use: No  . Sexual activity: Not on file  Lifestyle  . Physical activity:    Days per week: Not on file    Minutes per session: Not on file  . Stress: Not on file  Relationships  . Social connections:    Talks on phone: Not on file    Gets together: Not on file    Attends religious service: Not on file     Active member of club or organization: Not on file    Attends meetings of clubs or organizations: Not on file    Relationship status: Not on file  Other Topics Concern  . Not on file  Social History Narrative  . Not on file    Allergies:  Allergies  Allergen Reactions  . Ativan [Lorazepam]     Caused depression  . Penicillins     CHILDHOOD    Metabolic Disorder Labs: No results found for: HGBA1C, MPG No results found for: PROLACTIN No results found for: CHOL, TRIG, HDL, CHOLHDL, VLDL, LDLCALC No results found for: TSH  Therapeutic Level Labs: No results found for: LITHIUM No results found for: VALPROATE No components found for:  CBMZ  Current Medications: No current outpatient medications on file.   No current facility-administered medications for this visit.     Musculoskeletal: Strength & Muscle Tone: within normal limits Gait & Station: normal Patient leans: N/A  Psychiatric Specialty Exam: ROS  There were no vitals taken for this visit.There is no height or weight on file to calculate BMI.  General Appearance: Casual and Well Groomed  Eye Contact:  Good  Speech:  Clear and Coherent and Normal Rate  Volume:  Normal  Mood:  Euthymic  Affect:  Congruent  Thought Process:  Goal Directed and Descriptions of Associations: Intact  Orientation:  Full (Time, Place, and Person)  Thought Content: Logical   Suicidal Thoughts:  No  Homicidal Thoughts:  No  Memory:  Immediate;   Good  Judgement:  Good  Insight:  Fair  Psychomotor Activity:  Normal  Concentration:  Concentration: Good  Recall:  Good  Fund of Knowledge: Good  Language: Good  Akathisia:  Negative  Handed:  Right  AIMS (if indicated): not done  Assets:  Communication Skills Desire for Improvement Housing Physical Health Talents/Skills Transportation Vocational/Educational  ADL's:  Intact  Cognition: WNL  Sleep:  Fair   Screenings:   Assessment and Plan: Jose Mccullough presents for  med management follow-up and feels like he wants to give it a break with medications because he feels like the side effects are not worth any of the benefits that he gets.  We agreed to follow-up on an as-needed basis.  He has no acute safety issues or substance abuse concerns at this time.  1. Hypersomnia, idiopathic     Status of current problems: stable  Labs Ordered: No orders of the defined types were placed in this encounter.   Labs Reviewed: na  Collateral Obtained/Records Reviewed: [place] controlled substance database  Plan:  No prescribed medications Follow-up as needed  Burnard Leigh, MD 05/05/2018, 3:04 PM

## 2018-06-02 ENCOUNTER — Encounter (HOSPITAL_COMMUNITY): Payer: Self-pay

## 2018-06-02 ENCOUNTER — Emergency Department (HOSPITAL_COMMUNITY)
Admission: EM | Admit: 2018-06-02 | Discharge: 2018-06-02 | Disposition: A | Discharge status: Home / Self Care | Payer: Self-pay | Attending: Emergency Medicine | Admitting: Emergency Medicine

## 2018-06-02 ENCOUNTER — Other Ambulatory Visit: Payer: Self-pay

## 2018-06-02 DIAGNOSIS — F419 Anxiety disorder, unspecified: Secondary | ICD-10-CM | POA: Insufficient documentation

## 2018-06-02 DIAGNOSIS — R5383 Other fatigue: Secondary | ICD-10-CM | POA: Insufficient documentation

## 2018-06-02 DIAGNOSIS — Z87891 Personal history of nicotine dependence: Secondary | ICD-10-CM | POA: Insufficient documentation

## 2018-06-02 LAB — COMPREHENSIVE METABOLIC PANEL
ALK PHOS: 45 U/L (ref 38–126)
ALT: 37 U/L (ref 0–44)
AST: 29 U/L (ref 15–41)
Albumin: 4.3 g/dL (ref 3.5–5.0)
Anion gap: 11 (ref 5–15)
BILIRUBIN TOTAL: 0.8 mg/dL (ref 0.3–1.2)
BUN: 16 mg/dL (ref 6–20)
CALCIUM: 9.3 mg/dL (ref 8.9–10.3)
CO2: 22 mmol/L (ref 22–32)
Chloride: 106 mmol/L (ref 98–111)
Creatinine, Ser: 0.92 mg/dL (ref 0.61–1.24)
GFR calc non Af Amer: >60 mL/min (ref >60)
GLUCOSE: 92 mg/dL (ref 70–99)
Potassium: 4 mmol/L (ref 3.5–5.1)
Sodium: 139 mmol/L (ref 135–145)
TOTAL PROTEIN: 7.5 g/dL (ref 6.5–8.1)

## 2018-06-02 LAB — CBC WITH DIFFERENTIAL/PLATELET
BASOS ABS: 0 10*3/uL (ref 0.0–0.1)
BASOS PCT: 0 %
Eosinophils Absolute: 0.1 10*3/uL (ref 0.0–0.7)
Eosinophils Relative: 1 %
HEMATOCRIT: 41.9 % (ref 39.0–52.0)
Hemoglobin: 14.2 g/dL (ref 13.0–17.0)
Lymphocytes Relative: 35 %
Lymphs Abs: 1.9 10*3/uL (ref 0.7–4.0)
MCH: 31.5 pg (ref 26.0–34.0)
MCHC: 33.9 g/dL (ref 30.0–36.0)
MCV: 92.9 fL (ref 78.0–100.0)
Monocytes Absolute: 0.4 10*3/uL (ref 0.1–1.0)
Monocytes Relative: 7 %
NEUTROS ABS: 3.1 10*3/uL (ref 1.7–7.7)
NEUTROS PCT: 57 %
Platelets: 251 10*3/uL (ref 150–400)
RBC: 4.51 MIL/uL (ref 4.22–5.81)
RDW: 12.3 % (ref 11.5–15.5)
WBC: 5.5 10*3/uL (ref 4.0–10.5)

## 2018-06-02 LAB — RAPID URINE DRUG SCREEN, HOSP PERFORMED
Amphetamines: NOT DETECTED
Barbiturates: NOT DETECTED
Benzodiazepines: NOT DETECTED
COCAINE: NOT DETECTED
Tetrahydrocannabinol: NOT DETECTED

## 2018-06-02 LAB — TSH: TSH: 1.331 u[IU]/mL (ref 0.350–4.500)

## 2018-06-02 NOTE — ED Provider Notes (Signed)
[facility]-EMERGENCY DEPT Provider Note   CSN: [id number] Arrival date & time: 06/02/18  1633     History   Chief Complaint Chief Complaint  Patient presents with  . Fatigue    HPI Jose Mccullough is a 32 y.o. male.  32yo male presents with complaint of fatigue and anxiety. Patient states he has had a problem with daily anxiety for the past 5 years. Went to his PCP who sent him to see Dr. Rene KocherEksir. Patient has been seeing Dr. Rene KocherEksir for the past 18 months, diagnosed with hypersomnia. States he has tried several different medications which are not helping him (has tried vistaril, Provigil, adderall). Patient has tried taking vitamin supplements without any improvement. 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 Patient states he has similar panic attacks daily, denies stressful event preceding symptoms. Reports sleeping 6-7 hours nightly, significant other with him states he sleeps less than this, has difficulty falling asleep. States he had a full physical with his PCP in July 2019 and everything was normal at at that time regarding any lab work. Denies history of anemia or thyroid dysfunction, black/tarry stools. No other complaints or concerns.      Past Medical History:  Diagnosis Date  . Anxiety     There are no active problems to display for this patient.   History reviewed. No pertinent surgical history.      Home Medications    Prior to Admission medications   Not on File    Family History Family History  Problem Relation Age of Onset  . Diabetes Maternal Grandmother     Social History Social History   Tobacco Use  . Smoking status: Former Games developermoker  . Smokeless tobacco: Never Used  Substance Use Topics  . Alcohol use: Yes    Comment: occasional  . Drug use: No     Allergies   Ativan [lorazepam] and  Penicillins   Review of Systems Review of Systems  Constitutional: Positive for fatigue. Negative for fever.  Gastrointestinal: Negative for blood in stool.  Musculoskeletal: Negative for arthralgias and myalgias.  Skin: Negative for wound.  Allergic/Immunologic: Negative for immunocompromised state.  Neurological: Negative for headaches.  Psychiatric/Behavioral: Positive for sleep disturbance. Negative for suicidal ideas. The patient is nervous/anxious.   All other systems reviewed and are negative.    Physical Exam Updated Vital Signs BP (!) 129/26 (BP Location: Right Arm)   Pulse 86   Temp 98.3 F (36.8 C) (Oral)   Resp 14   Ht 5\' 11"  (1.803 m)   Wt 82.6 kg   SpO2 100%   BMI 25.38 kg/m   Physical Exam  Constitutional: He is oriented to person, place, and time. He appears well-developed and well-nourished. No distress.  HENT:  Head: Normocephalic and atraumatic.  Right Ear: External ear normal. No drainage, swelling or tenderness. Tympanic membrane is not erythematous. No middle ear effusion.  Left Ear: External ear normal. No drainage, swelling or tenderness. Tympanic membrane is not erythematous. A middle ear effusion is present.  Nose: Nose normal.  Mouth/Throat: Oropharynx is clear and moist.  Eyes: Conjunctivae are normal.  Neck: Neck supple.  Cardiovascular: Normal rate, regular rhythm, normal heart sounds and intact distal pulses.  No murmur heard. Pulmonary/Chest: Effort normal and breath sounds normal. No respiratory distress.  Neurological: He is alert and oriented  to person, place, and time.  Skin: Skin is warm and dry. No rash noted. He is not diaphoretic.  Psychiatric: He has a normal mood and affect. His behavior is normal.  Nursing note and vitals reviewed.    ED Treatments / Results  Labs (all labs ordered are listed, but only abnormal results are displayed) Labs Reviewed  RAPID URINE DRUG SCREEN, HOSP PERFORMED - Abnormal; Notable for the  following components:      Result Value   Opiates   (*)    Value: Result not available. Reagent lot number recalled by manufacturer.   All other components within normal limits  COMPREHENSIVE METABOLIC PANEL  CBC WITH DIFFERENTIAL/PLATELET  TSH    EKG None  Radiology No results found.  Procedures Procedures (including critical care time)  Medications Ordered in ED Medications - No data to display   Initial Impression / Assessment and Plan / ED Course  I have reviewed the triage vital signs and the nursing notes.  Pertinent labs & imaging results that were available during my care of the patient were reviewed by me and considered in my medical decision making (see chart for details).  Clinical Course as of Jun 03 1855  Wed Jun 02, 2018  [id number] 32 year old male with complaints of chronic fatigue and anxiety.  Patient has seen his PCP as well as psychiatry, tried Provigil and Adderall recently without improvement in his symptoms.  Patient denies use of drugs or alcohol (significant other in the room disagrees with alcohol consumption). Review of patient's records with Dr. Rene KocherEksir, last visit was last month when patient planned to stop meds and try a more natural treatment for his symptoms which is why he is taking vitamin B currently. Patient denies SI/HI. Discussed limits of ER workup regarding complaint of fatigue, CBC is normal, patient is not anemic, TSH WNL, CMP normal, UDS negative. Recommend patient follow up with his PCP and psychiatrist. Offered referral for new psych eval, patient declines. Patient would like daily medication for his anxiety, offered Vistaril and states he has already taken this- feels like it helps "take the edge off" but not manage his daily symptoms. Recommend patient look into biofeedback, also try Melatonin for sleep and reval with current providers.    [LM]    Clinical Course User Index [LM] Jeannie FendMurphy, Tiarna Koppen A, PA-C   Final Clinical Impressions(s) / ED  Diagnoses   Final diagnoses:  Fatigue, unspecified type  Anxiety    ED Discharge Orders    None       Alden HippMurphy, Shannon Kirkendall A, PA-C 06/02/18 [id number]    Zammit, Joseph, MD 06/03/18 1226

## 2018-06-02 NOTE — Discharge Instructions (Addendum)
Follow up with your primary care provider. Your lab work today is norma- CBC and CMP, normal hemoglobin-  no anemia. Your TSH (thyroid test) has been sent out.  Try Melatonin at night for sleep, this is over the counter as a natural supplement for sleep. Research/try biofeedback to help with anxiety.

## 2018-06-02 NOTE — ED Triage Notes (Signed)
Pt arrives c/o feeling "fatigued" "not right" and having more panic attacks over the past week.  States he was taking adderall for anxiety, but stopped 3 weeks ago and is unsure if he is having withdraws from not being on this medication. Pt does not have any complaints at this time, no pain.

## 2018-06-03 ENCOUNTER — Ambulatory Visit (INDEPENDENT_AMBULATORY_CARE_PROVIDER_SITE_OTHER): Payer: 59 | Admitting: Psychiatry

## 2018-06-03 ENCOUNTER — Encounter (HOSPITAL_COMMUNITY): Payer: Self-pay | Admitting: Psychiatry

## 2018-06-03 VITALS — BP 141/91 | HR 104 | Resp 12 | Ht 71.0 in | Wt 183.6 lb

## 2018-06-03 DIAGNOSIS — F410 Panic disorder [episodic paroxysmal anxiety] without agoraphobia: Secondary | ICD-10-CM | POA: Diagnosis not present

## 2018-06-03 DIAGNOSIS — Z87891 Personal history of nicotine dependence: Secondary | ICD-10-CM | POA: Diagnosis not present

## 2018-06-03 MED ORDER — DIAZEPAM 5 MG PO TABS: ORAL_TABLET | ORAL | 0 refills | Status: DC

## 2018-06-03 NOTE — Progress Notes (Signed)
BH MD/PA/NP OP Progress Note  06/03/2018 4:32 PM Jose Mccullough  MRN:  [id number]  Chief Complaint: med management  HPI: Jose Mccullough emergency department yesterday.  He had a panic attack.  He reports that up until then he was doing fine over the past few weeks.  I spent time with him discussing daily medications for anxiety and he is very apprehensive about this.  He is receptive to a referral for biofeedback so I provided him the name of a provider in the area that does this, and we agreed for him to have some as needed Valium for acute anxiety or panic up to 3 times a week.  We also agreed to obtain the genetic pharmacologic testing to assess his response given that he has been very sensitive to multiple psychotropic agents, so that we can decide on a future path if he does ultimately want to take a daily medication.  Visit Diagnosis:    ICD-10-CM   1. Panic attacks F41.0 diazepam (VALIUM) 5 MG tablet    Past Psychiatric History: See intake H&P for full details. Reviewed, with no updates at this time.   Past Medical History:  Past Medical History:  Diagnosis Date  . Anxiety    History reviewed. No pertinent surgical history.  Family Psychiatric History: See intake H&P for full details. Reviewed, with no updates at this time.   Family History:  Family History  Problem Relation Age of Onset  . Diabetes Maternal Grandmother     Social History:  Social History   Socioeconomic History  . Marital status: Single    Spouse name: Not on file  . Number of children: Not on file  . Years of education: Not on file  . Highest education level: Not on file  Occupational History  . Not on file  Social Needs  . Financial resource strain: Not on file  . Food insecurity:    Worry: Not on file    Inability: Not on file  . Transportation needs:    Medical: Not on file    Non-medical: Not on file  Tobacco Use  . Smoking status: Former Games developermoker  . Smokeless tobacco: Never Used   Substance and Sexual Activity  . Alcohol use: Yes    Comment: occasional  . Drug use: No  . Sexual activity: Not on file  Lifestyle  . Physical activity:    Days per week: Not on file    Minutes per session: Not on file  . Stress: Not on file  Relationships  . Social connections:    Talks on phone: Not on file    Gets together: Not on file    Attends religious service: Not on file    Active member of club or organization: Not on file    Attends meetings of clubs or organizations: Not on file    Relationship status: Not on file  Other Topics Concern  . Not on file  Social History Narrative  . Not on file    Allergies:  Allergies  Allergen Reactions  . Ativan [Lorazepam]     Caused depression  . Penicillins     CHILDHOOD    Metabolic Disorder Labs: No results found for: HGBA1C, MPG No results found for: PROLACTIN No results found for: CHOL, TRIG, HDL, CHOLHDL, VLDL, LDLCALC Lab Results  Component Value Date   TSH 1.331 06/02/2018    Therapeutic Level Labs: No results found for: LITHIUM No results found for: VALPROATE No components found for:  CBMZ  Current Medications: Current Outpatient Medications  Medication Sig Dispense Refill  . diazepam (VALIUM) 5 MG tablet For acute anxiety or panic attacks, okay to use up to 3 times per week 30 tablet 0   No current facility-administered medications for this visit.     Musculoskeletal: Strength & Muscle Tone: within normal limits Gait & Station: normal Patient leans: N/A  Psychiatric Specialty Exam: ROS  Blood pressure (!) 141/91, pulse (!) 104, resp. rate 12, height 5\' 11"  (1.803 m), weight 183 lb 9.6 oz (83.3 kg).Body mass index is 25.61 kg/m.  General Appearance: Casual and Well Groomed  Eye Contact:  Good  Speech:  Clear and Coherent and Normal Rate  Volume:  Normal  Mood:  Euthymic and fine right now  Affect:  Congruent  Thought Process:  Goal Directed and Descriptions of Associations: Intact   Orientation:  Full (Time, Place, and Person)  Thought Content: Logical   Suicidal Thoughts:  No  Homicidal Thoughts:  No  Memory:  Immediate;   Good  Judgement:  Good  Insight:  Fair  Psychomotor Activity:  Normal  Concentration:  Concentration: Good  Recall:  Good  Fund of Knowledge: Good  Language: Good  Akathisia:  Negative  Handed:  Right  AIMS (if indicated): not done  Assets:  Communication Skills Desire for Improvement Housing Physical Health Talents/Skills Transportation Vocational/Educational  ADL's:  Intact  Cognition: WNL  Sleep:  Fair   Screenings:   Assessment and Plan: Jose Mccullough presents for med management follow-up.  Had an acute anxiety/panic attack and was in the Er yesterday.  Quite opposed to daily medications but we agreed for PRN valium for acute panic/anxiety, and proceed with genesite testing to find a more agreeable medication given that he has had multiple medication sensitivities.   1. Panic attacks     Status of current problems: stable  Labs Ordered: No orders of the defined types were placed in this encounter.   Labs Reviewed: na  Collateral Obtained/Records Reviewed: [place] controlled substance database  Plan:  PRN valium 5 mg; okay to use up to 3 times a week; 30 tablets prescribed Genesight testing ordered  Burnard LeighAlexander Arya Lilo [place], MD 06/03/2018, 4:32 PM

## 2018-06-03 NOTE — Patient Instructions (Signed)
Call Dr. Delsa Granaurgin at [facility] to learn about biofeedback options [phone]

## 2019-04-29 ENCOUNTER — Encounter: Payer: Self-pay | Admitting: Emergency Medicine

## 2019-04-29 ENCOUNTER — Ambulatory Visit
Admission: EM | Admit: 2019-04-29 | Discharge: 2019-04-29 | Disposition: A | Discharge status: Home / Self Care | Payer: Self-pay | Attending: Physician Assistant | Admitting: Physician Assistant

## 2019-04-29 ENCOUNTER — Other Ambulatory Visit: Payer: Self-pay

## 2019-04-29 DIAGNOSIS — M25562 Pain in left knee: Secondary | ICD-10-CM

## 2019-04-29 MED ORDER — MELOXICAM 7.5 MG PO TABS: 7.5000 mg | ORAL_TABLET | Freq: Every day | ORAL | 0 refills | Status: DC

## 2019-04-29 NOTE — Discharge Instructions (Signed)
As discussed, cannot rule out injury to the meniscus, ligaments. Start Mobic. Do not take ibuprofen (motrin/advil)/ naproxen (aleve) while on mobic.  Ice compress, elevation, knee sleeve during activity.  As discussed, this may take a few weeks to completely resolve, but should be feeling better each week.  Follow-up with orthopedics for further evaluation if symptoms not improving.

## 2019-04-29 NOTE — ED Triage Notes (Signed)
Pt presents to [facility] for assessment of left knee pain after he fell and hit his knee cap on the ground.  Patient denies pain constantly, states pain is "when I move a certain way".  C/o "just a little" swelling.

## 2019-04-29 NOTE — ED Notes (Signed)
Patient able to ambulate independently  

## 2019-04-29 NOTE — ED Provider Notes (Signed)
[facility]    CSN: [id number] Arrival date & time: 04/29/19  1504     History   Chief Complaint Chief Complaint  Patient presents with  . Knee Pain    HPI Jose Mccullough is a 33 y.o. male.   33 year old male comes in with 2-day history of left knee pain after injury.  Patient states slipped on wet floor, fell towards the right, and in the process twisted the left knee.  He was able to ambulate on her own after incident, though with limping and painful weightbearing.  States has been doing ice compress with some relief.  Swelling that has since resolved.  States no pain at rest, pain during range of motion and weightbearing.  Pain is to the lateral joint line. Denies numbness/tingling.      Past Medical History:  Diagnosis Date  . Anxiety     There are no active problems to display for this patient.   History reviewed. No pertinent surgical history.     Home Medications    Prior to Admission medications   Medication Sig Start Date End Date Taking? Authorizing Provider  diazepam (VALIUM) 5 MG tablet For acute anxiety or panic attacks, okay to use up to 3 times per week 06/03/18   Eksir, Bo McclintockAlexander Arya, MD  meloxicam (MOBIC) 7.5 MG tablet Take 1 tablet (7.5 mg total) by mouth daily. 04/29/19   Belinda FisherYu, Jream Broyles V, PA-C    Family History Family History  Problem Relation Age of Onset  . Diabetes Maternal Grandmother     Social History Social History   Tobacco Use  . Smoking status: Former Games developermoker  . Smokeless tobacco: Never Used  Substance Use Topics  . Alcohol use: Yes    Comment: occasional  . Drug use: No     Allergies   Ativan [lorazepam] and Penicillins   Review of Systems Review of Systems  Reason unable to perform ROS: See HPI as above.     Physical Exam Triage Vital Signs ED Triage Vitals  Enc Vitals Group     BP 04/29/19 1508 (!) 164/103     Pulse Rate 04/29/19 1508 82     Resp 04/29/19 1508 16     Temp 04/29/19 1508 98.5 F (36.9  C)     Temp Source 04/29/19 1508 Oral     SpO2 04/29/19 1508 98 %     Weight --      Height --      Head Circumference --      Peak Flow --      Pain Score 04/29/19 1507 7     Pain Loc --      Pain Edu? --      Excl. in GC? --    No data found.  Updated Vital Signs BP (!) 164/103 (BP Location: Right Arm)   Pulse 82   Temp 98.5 F (36.9 C) (Oral)   Resp 16   SpO2 98%   Physical Exam Constitutional:      General: He is not in acute distress.    Appearance: He is well-developed. He is not diaphoretic.  HENT:     Head: Normocephalic and atraumatic.  Eyes:     Conjunctiva/sclera: Conjunctivae normal.     Pupils: Pupils are equal, round, and reactive to light.  Musculoskeletal:     Comments: No swelling, erythema, warmth, contusion seen. Tenderness to palpation along lateral joint line. Full ROM of knee. Strength normal and equal bilaterally. Sensation intact and equal  bilaterally. Able to ambulate on own without difficulty.   Neurological:     Mental Status: He is alert and oriented to person, place, and time.      UC Treatments / Results  Labs (all labs ordered are listed, but only abnormal results are displayed) Labs Reviewed - No data to display  EKG   Radiology No results found.  Procedures Procedures (including critical care time)  Medications Ordered in UC Medications - No data to display  Initial Impression / Assessment and Plan / UC Course  I have reviewed the triage vital signs and the nursing notes.  Pertinent labs & imaging results that were available during my care of the patient were reviewed by me and considered in my medical decision making (see chart for details).    No indications for xray at this time. Discussed possible meniscus injury. Will have patient take NSAIDs, ice compress, rest, elevation. Knee brace during activity. Return precautions given. Patient expresses understanding and agrees to plan.  Final Clinical Impressions(s) / UC  Diagnoses   Final diagnoses:  Acute pain of left knee   ED Prescriptions    Medication Sig Dispense Auth. Provider   meloxicam (MOBIC) 7.5 MG tablet Take 1 tablet (7.5 mg total) by mouth daily. 15 tablet Tobin Chad, [place] 04/29/19 1640

## 2020-01-11 ENCOUNTER — Encounter: Payer: Self-pay | Admitting: Registered Nurse

## 2020-01-11 ENCOUNTER — Other Ambulatory Visit: Payer: Self-pay

## 2020-01-11 ENCOUNTER — Ambulatory Visit (INDEPENDENT_AMBULATORY_CARE_PROVIDER_SITE_OTHER): Payer: BC Managed Care – PPO | Admitting: Registered Nurse

## 2020-01-11 VITALS — BP 136/95 | HR 97 | Temp 97.5°F | Resp 17 | Ht 71.0 in | Wt 197.0 lb

## 2020-01-11 DIAGNOSIS — Z13220 Encounter for screening for lipoid disorders: Secondary | ICD-10-CM

## 2020-01-11 DIAGNOSIS — Z7689 Persons encountering health services in other specified circumstances: Secondary | ICD-10-CM

## 2020-01-11 DIAGNOSIS — Z130 Encounter for screening for diseases of the blood and blood-forming organs and certain disorders involving the immune mechanism: Secondary | ICD-10-CM

## 2020-01-11 DIAGNOSIS — Z13228 Encounter for screening for other metabolic disorders: Secondary | ICD-10-CM | POA: Diagnosis not present

## 2020-01-11 DIAGNOSIS — R002 Palpitations: Secondary | ICD-10-CM

## 2020-01-11 DIAGNOSIS — Z1329 Encounter for screening for other suspected endocrine disorder: Secondary | ICD-10-CM

## 2020-01-11 NOTE — Patient Instructions (Signed)
° ° ° °  If you have lab work done today you will be contacted with your lab results within the next 2 weeks.  If you have not heard from us then please contact us. The fastest way to get your results is to register for My Chart. ° ° °IF you received an x-ray today, you will receive an invoice from [facility]. Please contact [facility] at [phone] with questions or concerns regarding your invoice.  ° °IF you received labwork today, you will receive an invoice from [facility]. Please contact [facility] at [phone] with questions or concerns regarding your invoice.  ° °Our billing staff will not be able to assist you with questions regarding bills from these companies. ° °You will be contacted with the lab results as soon as they are available. The fastest way to get your results is to activate your My Chart account. Instructions are located on the last page of this paperwork. If you have not heard from us regarding the results in 2 weeks, please contact this office. °  ° ° ° °

## 2020-01-12 ENCOUNTER — Ambulatory Visit (INDEPENDENT_AMBULATORY_CARE_PROVIDER_SITE_OTHER): Payer: BC Managed Care – PPO | Admitting: Cardiovascular Disease

## 2020-01-12 ENCOUNTER — Encounter: Payer: Self-pay | Admitting: Cardiovascular Disease

## 2020-01-12 ENCOUNTER — Telehealth: Payer: Self-pay | Admitting: Radiology

## 2020-01-12 VITALS — BP 110/70 | HR 59 | Ht 67.0 in | Wt 194.0 lb

## 2020-01-12 DIAGNOSIS — R9431 Abnormal electrocardiogram [ECG] [EKG]: Secondary | ICD-10-CM

## 2020-01-12 DIAGNOSIS — R002 Palpitations: Secondary | ICD-10-CM

## 2020-01-12 DIAGNOSIS — R5383 Other fatigue: Secondary | ICD-10-CM

## 2020-01-12 LAB — COMPREHENSIVE METABOLIC PANEL
ALT: 37 IU/L (ref 0–44)
AST: 23 IU/L (ref 0–40)
Albumin/Globulin Ratio: 2 (ref 1.2–2.2)
Albumin: 4.5 g/dL (ref 4.0–5.0)
Alkaline Phosphatase: 70 IU/L (ref 39–117)
BUN/Creatinine Ratio: 11 (ref 9–20)
BUN: 11 mg/dL (ref 6–20)
Bilirubin Total: 0.3 mg/dL (ref 0.0–1.2)
CO2: 19 mmol/L — ABNORMAL LOW (ref 20–29)
Calcium: 8.8 mg/dL (ref 8.7–10.2)
Chloride: 106 mmol/L (ref 96–106)
Creatinine, Ser: 0.97 mg/dL (ref 0.76–1.27)
GFR calc Af Amer: 116 mL/min/{1.73_m2} (ref >59)
GFR calc non Af Amer: 101 mL/min/{1.73_m2} (ref >59)
Globulin, Total: 2.3 g/dL (ref 1.5–4.5)
Glucose: 133 mg/dL — ABNORMAL HIGH (ref 65–99)
Potassium: 4.2 mmol/L (ref 3.5–5.2)
Sodium: 141 mmol/L (ref 134–144)
Total Protein: 6.8 g/dL (ref 6.0–8.5)

## 2020-01-12 LAB — CBC WITH DIFFERENTIAL
Basophils Absolute: 0 10*3/uL (ref 0.0–0.2)
Basos: 1 %
EOS (ABSOLUTE): 0.2 10*3/uL (ref 0.0–0.4)
Eos: 3 %
Hematocrit: 42.6 % (ref 37.5–51.0)
Hemoglobin: 14.7 g/dL (ref 13.0–17.7)
Immature Grans (Abs): 0 10*3/uL (ref 0.0–0.1)
Immature Granulocytes: 0 %
Lymphocytes Absolute: 2.6 10*3/uL (ref 0.7–3.1)
Lymphs: 42 %
MCH: 31 pg (ref 26.6–33.0)
MCHC: 34.5 g/dL (ref 31.5–35.7)
MCV: 90 fL (ref 79–97)
Monocytes Absolute: 0.6 10*3/uL (ref 0.1–0.9)
Monocytes: 9 %
Neutrophils Absolute: 2.7 10*3/uL (ref 1.4–7.0)
Neutrophils: 45 %
RBC: 4.74 x10E6/uL (ref 4.14–5.80)
RDW: 12.8 % (ref 11.6–15.4)
WBC: 6 10*3/uL (ref 3.4–10.8)

## 2020-01-12 LAB — HEMOGLOBIN A1C
Est. average glucose Bld gHb Est-mCnc: 123 mg/dL
Hgb A1c MFr Bld: 5.9 % — ABNORMAL HIGH (ref 4.8–5.6)

## 2020-01-12 LAB — TSH: TSH: 1.37 u[IU]/mL (ref 0.450–4.500)

## 2020-01-12 LAB — LIPID PANEL
Chol/HDL Ratio: 5 ratio (ref 0.0–5.0)
Cholesterol, Total: 186 mg/dL (ref 100–199)
HDL: 37 mg/dL — ABNORMAL LOW (ref >39)
LDL Chol Calc (NIH): 63 mg/dL (ref 0–99)
Triglycerides: 568 mg/dL (ref 0–149)
VLDL Cholesterol Cal: 86 mg/dL — ABNORMAL HIGH (ref 5–40)

## 2020-01-12 NOTE — Progress Notes (Signed)
Cardiology Office Note:   Date:  01/12/2020  NAME:  Jose Mccullough    MRN: [id number] DOB:  1985/11/09   PCP:  Janeece Agee, NP  Cardiologist:  No primary care provider on file.   Referring MD: Janeece Agee, NP   Chief Complaint  Patient presents with  . New Patient (Initial Visit)  . Chest Pain  . Shortness of Breath  . Palpitations   History of Present Illness:   Jose Mccullough is a 34 y.o. male with a hx of anxiety/depression who is being seen today for the evaluation of palpitations/abnormal EKG at the request of Janeece Agee, NP.  He presents for evaluation of anxiety and palpitations.  He apparently had this for the better part of 10 years.  He reports he is constantly fatigued and gets palpitations daily.  They occur every day.  He reports when he tries to do things he gets extremely anxious [place] and this then results in palpitations.  He reports he can get palpitations at any time.  They can last 10 to 15 minutes.  They resolve without intervention.  He denies any chest pain with this.  Associated symptoms do include fatigue and shortness of breath.  He has had a Holter monitor in the past in 2017 which showed no significant arrhythmias.  I see no recent echocardiogram he reports has had them as well.  He was evaluated by his primary care physician yesterday who noted that in lead III and aVF he had T wave inversions as well as what appears to be QRS prolongation versus short PR.  This does not appear to be short PR and most of the leads and the PR interval is 150 ms.  I see no obvious delta wave.  I did review the recent lab work from his primary care physician office.  A1c 5.9, TSH 1.37.  His hemoglobin was noted to be 14.7.  He did have a significantly elevated triglycerides but he was not fasting.  He is a non-smoker.  No history of hypertension or myocardial infarction.  No strong family history of heart disease.  Past Medical History: Past  Medical History:  Diagnosis Date  . Anxiety     Past Surgical History: History reviewed. No pertinent surgical history.  Current Medications: No outpatient medications have been marked as taking for the 01/12/20 encounter (Office Visit) with Sande Rives, MD.     Allergies:    Ativan [lorazepam] and Penicillins   Social History: Social History   Socioeconomic History  . Marital status: Single    Spouse name: Not on file  . Number of children: 2  . Years of education: Not on file  . Highest education level: Not on file  Occupational History  . Not on file  Tobacco Use  . Smoking status: Former Smoker    Years: 13.00  . Smokeless tobacco: Never Used  Substance and Sexual Activity  . Alcohol use: Yes    Comment: occasional  . Drug use: No  . Sexual activity: Not on file  Other Topics Concern  . Not on file  Social History Narrative  . Not on file   Social Determinants of Health   Financial Resource Strain:   . Difficulty of Paying Living Expenses:   Food Insecurity:   . Worried About Programme researcher, broadcasting/film/video in the Last Year:   . [employer] of Food in the Last Year:   Transportation Needs:   . Lack of  Transportation (Medical):   Marland Kitchen Lack of Transportation (Non-Medical):   Physical Activity:   . Days of Exercise per Week:   . Minutes of Exercise per Session:   Stress:   . Feeling of Stress :   Social Connections:   . Frequency of Communication with Friends and Family:   . Frequency of Social Gatherings with Friends and Family:   . Attends Religious Services:   . Active Member of Clubs or Organizations:   . Attends Archivist Meetings:   Marland Kitchen Marital Status:      Family History: The patient's family history includes Diabetes in his maternal grandmother.  ROS:   All other ROS reviewed and negative. Pertinent positives noted in the HPI.     EKGs/Labs/Other Studies Reviewed:   The following studies were personally reviewed by me today:  EKG:  EKG  is ordered today.  The ekg ordered today demonstrates sinus bradycardia, heart rate 59, inferior T wave inversions noted no evidence of prior infarction, and was personally reviewed by me.   Recent Labs: 01/11/2020: ALT 37; BUN 11; Creatinine, Ser 0.97; Hemoglobin 14.7; Potassium 4.2; Sodium 141; TSH 1.370   Recent Lipid Panel    Component Value Date/Time   CHOL 186 01/11/2020 1523   TRIG 568 (HH) 01/11/2020 1523   HDL 37 (L) 01/11/2020 1523   CHOLHDL 5.0 01/11/2020 1523   LDLCALC 63 01/11/2020 1523    Physical Exam:   VS:  BP 110/70 (BP Location: Left Arm, Patient Position: Sitting, Cuff Size: Normal)   Pulse (!) 59   Ht 5\' 7"  (1.702 m)   Wt 194 lb (88 kg)   BMI 30.38 kg/m    Wt Readings from Last 3 Encounters:  01/12/20 194 lb (88 kg)  01/11/20 197 lb (89.4 kg)  06/02/18 182 lb (82.6 kg)    General: Well nourished, well developed, in no acute distress Heart: Atraumatic, normal size  Eyes: PEERLA, EOMI  Neck: Supple, no JVD Endocrine: No thryomegaly Cardiac: Normal S1, S2; RRR; no murmurs, rubs, or gallops Lungs: Clear to auscultation bilaterally, no wheezing, rhonchi or rales  Abd: Soft, nontender, no hepatomegaly  Ext: No edema, pulses 2+ Musculoskeletal: No deformities, BUE and BLE strength normal and equal Skin: Warm and dry, no rashes   Neuro: Alert and oriented to person, place, time, and situation, CNII-XII grossly intact, no focal deficits  Psych: Normal mood and affect   ASSESSMENT:   Jose Mccullough is a 34 y.o. male who presents for the following: 1. Palpitations   2. Fatigue, unspecified type   3. Nonspecific abnormal electrocardiogram (ECG) (EKG)     PLAN:   1. Palpitations 2. Fatigue, unspecified type -I suspect his symptoms are anxiety related.  He has had these for 10 years.  He had a monitor performed in 2017 that showed no arrhythmia.  His EKG is newly different.  He does have T wave inversions in the inferior leads.  I see no obvious delta wave and  he does have odd QRS deflection in the inferior leads.  We will repeat an echocardiogram as I do not see one recently in our system.  We will also proceed with a 3-day Zio patch to exclude any significant arrhythmia.  3. Nonspecific abnormal electrocardiogram (ECG) (EKG) -He does have T wave inversion in the inferior leads.  This is new from his EKG in 2017.  The QRS deflection also is abnormal.  This is new as well.  I see no obvious delta wave or  short PR interval.  We will go ahead and proceed with an echocardiogram as well as a 3-day Zio patch to exclude any significant arrhythmia.  We will plan to see him back in 1 month.   Disposition: Return in about 1 month (around 02/12/2020).  Medication Adjustments/Labs and Tests Ordered: Current medicines are reviewed at length with the patient today.  Concerns regarding medicines are outlined above.  Orders Placed This Encounter  Procedures  . LONG TERM MONITOR (3-14 DAYS)  . EKG 12-Lead  . ECHOCARDIOGRAM COMPLETE   No orders of the defined types were placed in this encounter.   Patient Instructions  Medication Instructions:  The current medical regimen is effective;  continue present plan and medications.  *If you need a refill on your cardiac medications before your next appointment, please call your pharmacy*   Testing/Procedures: Echocardiogram - Your physician has requested that you have an echocardiogram. Echocardiography is a painless test that uses sound waves to create images of your heart. It provides your doctor with information about the size and shape of your heart and how well your heart's chambers and valves are working. This procedure takes approximately one hour. There are no restrictions for this procedure. This will be performed at our [facility] location - [address], [address].  Your physician has recommended that you wear a 3 DAY ZIO-PATCH monitor. The Zio patch cardiac monitor continuously records heart rhythm data  for up to 14 days, this is for patients being evaluated for multiple types heart rhythms. For the first 24 hours post application, please avoid getting the Zio monitor wet in the shower or by excessive sweating during exercise. After that, feel free to carry on with regular activities. Keep soaps and lotions away from the ZIO XT Patch.  This will be mailed to you, please expect 7-10 days to receive.          Follow-Up: At [facility], you and your health needs are our priority.  As part of our continuing mission to provide you with exceptional heart care, we have created designated Provider Care Teams.  These Care Teams include your primary Cardiologist (physician) and Advanced Practice Providers (APPs -  Physician Assistants and Nurse Practitioners) who all work together to provide you with the care you need, when you need it.  We recommend signing up for the patient portal called "MyChart".  Sign up information is provided on this After Visit Summary.  MyChart is used to connect with patients for Virtual Visits (Telemedicine).  Patients are able to view lab/test results, encounter notes, upcoming appointments, etc.  Non-urgent messages can be sent to your provider as well.   To learn more about what you can do with MyChart, go to [email or web address].    Your next appointment:   1 month(s)  The format for your next appointment:   In Person  Provider:   Lennie Odor, MD       Signed, Lenna Gilford. Flora Lipps, MD [facility]  [address] [place], [place] [address] [phone]  01/12/2020 4:57 PM

## 2020-01-12 NOTE — Patient Instructions (Signed)
Medication Instructions:  The current medical regimen is effective;  continue present plan and medications.  *If you need a refill on your cardiac medications before your next appointment, please call your pharmacy*   Testing/Procedures: Echocardiogram - Your physician has requested that you have an echocardiogram. Echocardiography is a painless test that uses sound waves to create images of your heart. It provides your doctor with information about the size and shape of your heart and how well your heart's chambers and valves are working. This procedure takes approximately one hour. There are no restrictions for this procedure. This will be performed at our [facility] location - [address], [address].  Your physician has recommended that you wear a 3 DAY ZIO-PATCH monitor. The Zio patch cardiac monitor continuously records heart rhythm data for up to 14 days, this is for patients being evaluated for multiple types heart rhythms. For the first 24 hours post application, please avoid getting the Zio monitor wet in the shower or by excessive sweating during exercise. After that, feel free to carry on with regular activities. Keep soaps and lotions away from the ZIO XT Patch.  This will be mailed to you, please expect 7-10 days to receive.          Follow-Up: At [facility], you and your health needs are our priority.  As part of our continuing mission to provide you with exceptional heart care, we have created designated Provider Care Teams.  These Care Teams include your primary Cardiologist (physician) and Advanced Practice Providers (APPs -  Physician Assistants and Nurse Practitioners) who all work together to provide you with the care you need, when you need it.  We recommend signing up for the patient portal called "MyChart".  Sign up information is provided on this After Visit Summary.  MyChart is used to connect with patients for Virtual Visits (Telemedicine).  Patients are able to  view lab/test results, encounter notes, upcoming appointments, etc.  Non-urgent messages can be sent to your provider as well.   To learn more about what you can do with MyChart, go to [email or web address].    Your next appointment:   1 month(s)  The format for your next appointment:   In Person  Provider:   Lennie Odor, MD

## 2020-01-12 NOTE — Progress Notes (Signed)
Dr. Flora Lipps -  These are yesterday's labs from a patient I saw in office and referred over to see you.  In brief, he had palpitations and anxiety, and on EKG we noticed bunny ears in III, some ST abnormalities, and a dropped beat. He was given ER precautions and referred to cardiology. He is a very pleasant man.  Thank you for seeing him so quickly,  Jari Sportsman, NP

## 2020-01-12 NOTE — Telephone Encounter (Signed)
Enrolled patient for a 3 day Zio monitor to be mailed to patients home.  

## 2020-01-17 ENCOUNTER — Encounter: Payer: Self-pay | Admitting: Registered Nurse

## 2020-01-17 ENCOUNTER — Other Ambulatory Visit: Payer: Self-pay | Admitting: Registered Nurse

## 2020-01-17 ENCOUNTER — Ambulatory Visit (INDEPENDENT_AMBULATORY_CARE_PROVIDER_SITE_OTHER): Payer: BC Managed Care – PPO

## 2020-01-17 DIAGNOSIS — F411 Generalized anxiety disorder: Secondary | ICD-10-CM

## 2020-01-17 DIAGNOSIS — R002 Palpitations: Secondary | ICD-10-CM

## 2020-01-17 DIAGNOSIS — F410 Panic disorder [episodic paroxysmal anxiety] without agoraphobia: Secondary | ICD-10-CM

## 2020-01-17 MED ORDER — ESCITALOPRAM OXALATE 10 MG PO TABS: 10.0000 mg | ORAL_TABLET | Freq: Every day | ORAL | 0 refills | Status: DC

## 2020-01-17 MED ORDER — ALPRAZOLAM 0.5 MG PO TBDP: 0.5000 mg | ORAL_TABLET | Freq: Every evening | ORAL | 0 refills | Status: DC | PRN

## 2020-01-17 MED ORDER — ESCITALOPRAM OXALATE 5 MG PO TABS: 5.0000 mg | ORAL_TABLET | Freq: Every day | ORAL | 0 refills | Status: DC

## 2020-01-17 NOTE — Telephone Encounter (Signed)
Please advise 

## 2020-01-17 NOTE — Telephone Encounter (Signed)
Pt wants to know what he needs to do now since cardiology findings were negative

## 2020-01-27 ENCOUNTER — Other Ambulatory Visit (HOSPITAL_COMMUNITY): Payer: BC Managed Care – PPO

## 2020-02-05 ENCOUNTER — Encounter: Payer: Self-pay | Admitting: Registered Nurse

## 2020-02-05 NOTE — Progress Notes (Signed)
New Patient Office Visit  Subjective:  Patient ID: Jose Mccullough, male    DOB: 05-Apr-1986  Age: 34 y.o. MRN: [id number]  CC:  Chief Complaint  Patient presents with  . New Patient (Initial Visit)    establish care . patient states he feel like he is just going to pass out at time for a awhile now thats usually when he know he is about to have a anxiety attack . GAD7= 20    HPI Jose Mccullough presents for visit to establish care. Feels like he has been near syncopal for some time - often assoc with anxiety and panic attack like symptoms Has been going on for some time without tx - seen in ED for this a few times. No personal cardiac hx Denies loc, chest pain, headaches, shob, doe, dependent edema, claudication, nvd.   Past Medical History:  Diagnosis Date  . Anxiety     No past surgical history on file.  Family History  Problem Relation Age of Onset  . Diabetes Maternal Grandmother     Social History   Socioeconomic History  . Marital status: Single    Spouse name: Not on file  . Number of children: 2  . Years of education: Not on file  . Highest education level: Not on file  Occupational History  . Not on file  Tobacco Use  . Smoking status: Former Smoker    Years: 13.00  . Smokeless tobacco: Never Used  Substance and Sexual Activity  . Alcohol use: Yes    Comment: occasional  . Drug use: No  . Sexual activity: Not on file  Other Topics Concern  . Not on file  Social History Narrative  . Not on file   Social Determinants of Health   Financial Resource Strain:   . Difficulty of Paying Living Expenses:   Food Insecurity:   . Worried About Programme researcher, broadcasting/film/video in the Last Year:   . Barista in the Last Year:   Transportation Needs:   . Freight forwarder (Medical):   Marland Kitchen Lack of Transportation (Non-Medical):   Physical Activity:   . Days of Exercise per Week:   . Minutes of Exercise per Session:   Stress:   . Feeling of Stress :   Social  Connections:   . Frequency of Communication with Friends and Family:   . Frequency of Social Gatherings with Friends and Family:   . Attends Religious Services:   . Active Member of Clubs or Organizations:   . Attends Banker Meetings:   Marland Kitchen Marital Status:   Intimate Partner Violence:   . Fear of Current or Ex-Partner:   . Emotionally Abused:   Marland Kitchen Physically Abused:   . Sexually Abused:     ROS Review of Systems  Constitutional: Negative.   HENT: Negative.   Eyes: Negative.   Respiratory: Negative.   Cardiovascular: Positive for palpitations. Negative for chest pain and leg swelling.  Gastrointestinal: Negative.   Endocrine: Negative.   Genitourinary: Negative.   Musculoskeletal: Negative.   Skin: Negative.   Allergic/Immunologic: Negative.   Neurological: Negative.   Hematological: Negative.   Psychiatric/Behavioral: Negative for agitation, behavioral problems, confusion, decreased concentration, dysphoric mood, hallucinations, self-injury, sleep disturbance and suicidal ideas. The patient is nervous/anxious. The patient is not hyperactive.   All other systems reviewed and are negative.   Objective:   Today's Vitals: BP (!) 136/95   Pulse 97   Temp (!) 97.5 F (36.4  C) (Temporal)   Resp 17   Ht 5\' 11"  (1.803 m)   Wt 197 lb (89.4 kg)   SpO2 99%   BMI 27.48 kg/m   Physical Exam Vitals and nursing note reviewed.  Constitutional:      General: He is not in acute distress.    Appearance: Normal appearance. He is normal weight. He is not ill-appearing, toxic-appearing or diaphoretic.  Cardiovascular:     Rate and Rhythm: Normal rate and regular rhythm.     Pulses: Normal pulses.     Heart sounds: Normal heart sounds. No murmur. No friction rub. No gallop.   Pulmonary:     Effort: Pulmonary effort is normal. No respiratory distress.     Breath sounds: Normal breath sounds. No stridor. No wheezing, rhonchi or rales.  Chest:     Chest wall: No tenderness.   Skin:    Capillary Refill: Capillary refill takes less than 2 seconds.  Neurological:     General: No focal deficit present.     Mental Status: He is alert and oriented to person, place, and time. Mental status is at baseline.  Psychiatric:        Mood and Affect: Mood normal.        Behavior: Behavior normal.        Thought Content: Thought content normal.        Judgment: Judgment normal.     Assessment & Plan:   Problem List Items Addressed This Visit    None    Visit Diagnoses    Palpitations    -  Primary   Relevant Orders   EKG 12-Lead (Completed)   Ambulatory referral to Cardiology   Screening for endocrine, metabolic and immunity disorder       Relevant Orders   Comprehensive metabolic panel (Completed)   CBC With Differential (Completed)   TSH (Completed)   Hemoglobin A1c (Completed)   Lipid screening       Relevant Orders   Lipid panel (Completed)   Encounter to establish care          Outpatient Encounter Medications as of 01/11/2020  Medication Sig  . [DISCONTINUED] diazepam (VALIUM) 5 MG tablet For acute anxiety or panic attacks, okay to use up to 3 times per week (Patient not taking: Reported on 01/11/2020)  . [DISCONTINUED] meloxicam (MOBIC) 7.5 MG tablet Take 1 tablet (7.5 mg total) by mouth daily. (Patient not taking: Reported on 01/11/2020)   No facility-administered encounter medications on file as of 01/11/2020.    Follow-up: No follow-ups on file.   PLAN  Given sxs and borderline ekg, will draw labs, have him follow closely with cardiology, and return for tx of anxiety should he experience ongoing sxs  ER precautions given  Patient encouraged to call clinic with any questions, comments, or concerns.  Maximiano Coss, NP

## 2020-02-09 ENCOUNTER — Ambulatory Visit: Payer: BC Managed Care – PPO | Admitting: Cardiovascular Disease

## 2020-02-09 NOTE — Progress Notes (Deleted)
Cardiology Office Note:   Date:  02/09/2020  NAME:  Maycol Hoying    MRN: [id number] DOB:  May 16, 1986   PCP:  Maximiano Coss, NP  Cardiologist:  No primary care provider on file.  Electrophysiologist:  None   Referring MD: Maximiano Coss, NP   No chief complaint on file. ***  History of Present Illness:   Roshun Klingensmith is a 34 y.o. male with a hx of anxiety who presents for follow-up of palpitations. He was noted to have inferior T wave inversions and given his palpitations he was referred to [place]. Recent monitor shows symptoms are associated with palpitations. Awaiting echo next week.   Past Medical History: Past Medical History:  Diagnosis Date  . Anxiety     Past Surgical History: No past surgical history on file.  Current Medications: No outpatient medications have been marked as taking for the 02/09/20 encounter (Appointment) with O'Neal, Cassie Freer, MD.     Allergies:    Ativan [lorazepam] and Penicillins   Social History: Social History   Socioeconomic History  . Marital status: Single    Spouse name: Not on file  . Number of children: 2  . Years of education: Not on file  . Highest education level: Not on file  Occupational History  . Not on file  Tobacco Use  . Smoking status: Former Smoker    Years: 13.00  . Smokeless tobacco: Never Used  Substance and Sexual Activity  . Alcohol use: Yes    Comment: occasional  . Drug use: No  . Sexual activity: Not on file  Other Topics Concern  . Not on file  Social History Narrative  . Not on file   Social Determinants of Health   Financial Resource Strain:   . Difficulty of Paying Living Expenses:   Food Insecurity:   . Worried About Charity fundraiser in the Last Year:   . Arboriculturist in the Last Year:   Transportation Needs:   . Film/video editor (Medical):   Marland Kitchen Lack of Transportation (Non-Medical):   Physical Activity:   . Days of Exercise per Week:   . Minutes of Exercise per Session:    Stress:   . Feeling of Stress :   Social Connections:   . Frequency of Communication with Friends and Family:   . Frequency of Social Gatherings with Friends and Family:   . Attends Religious Services:   . Active Member of Clubs or Organizations:   . Attends Archivist Meetings:   Marland Kitchen Marital Status:      Family History: The patient's ***family history includes Diabetes in his maternal grandmother.  ROS:   All other ROS reviewed and negative. Pertinent positives noted in the HPI.     EKGs/Labs/Other Studies Reviewed:   The following studies were personally reviewed by me today:  EKG:  EKG is *** ordered today.  The ekg ordered today demonstrates ***, and was personally reviewed by me.   Zio Patch 01/17/2020 Max 169 bpm 12:26am, 03/31 Min 50 bpm 07:09am, 04/01 Avg 88 bpm Less than 1% PACs and PVCs 0 atrial fibrillation Predominant underlying rhythm was sinus rhythm Symptoms associated with sinus rhythm  Recent Labs: 01/11/2020: ALT 37; BUN 11; Creatinine, Ser 0.97; Hemoglobin 14.7; Potassium 4.2; Sodium 141; TSH 1.370   Recent Lipid Panel    Component Value Date/Time   CHOL 186 01/11/2020 1523   TRIG 568 (HH) 01/11/2020 1523   HDL 37 (L) 01/11/2020 1523  CHOLHDL 5.0 01/11/2020 1523   LDLCALC 63 01/11/2020 1523    Physical Exam:   VS:  There were no vitals taken for this visit.   Wt Readings from Last 3 Encounters:  01/12/20 194 lb (88 kg)  01/11/20 197 lb (89.4 kg)  06/02/18 182 lb (82.6 kg)    General: Well nourished, well developed, in no acute distress Heart: Atraumatic, normal size  Eyes: PEERLA, EOMI  Neck: Supple, no JVD Endocrine: No thryomegaly Cardiac: Normal S1, S2; RRR; no murmurs, rubs, or gallops Lungs: Clear to auscultation bilaterally, no wheezing, rhonchi or rales  Abd: Soft, nontender, no hepatomegaly  Ext: No edema, pulses 2+ Musculoskeletal: No deformities, BUE and BLE strength normal and equal Skin: Warm and dry, no rashes     Neuro: Alert and oriented to person, place, time, and situation, CNII-XII grossly intact, no focal deficits  Psych: Normal mood and affect   ASSESSMENT:   Murriel Eidem is a 34 y.o. male who presents for the following: No diagnosis found.  PLAN:   There are no diagnoses linked to this encounter.  Disposition: No follow-ups on file.  Medication Adjustments/Labs and Tests Ordered: Current medicines are reviewed at length with the patient today.  Concerns regarding medicines are outlined above.  No orders of the defined types were placed in this encounter.  No orders of the defined types were placed in this encounter.   There are no Patient Instructions on file for this visit.   Time Spent with Patient: I have spent a total of *** minutes with patient reviewing hospital notes, telemetry, EKGs, labs and examining the patient as well as establishing an assessment and plan that was discussed with the patient.  > 50% of time was spent in direct patient care.  Signed, Lenna Gilford. Flora Lipps, MD [facility] [facility]  [address], [address] [place], [place] [address] [phone]  02/09/2020 6:43 AM

## 2020-02-16 ENCOUNTER — Telehealth (HOSPITAL_COMMUNITY): Payer: Self-pay | Admitting: Cardiovascular Disease

## 2020-02-16 ENCOUNTER — Other Ambulatory Visit (HOSPITAL_COMMUNITY): Payer: BC Managed Care – PPO

## 2020-02-16 NOTE — Telephone Encounter (Signed)
Noted. Thanks Patient no showed previous appointment, no other appointment scheduled at this time.
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
## 2020-04-23 ENCOUNTER — Other Ambulatory Visit: Payer: Self-pay | Admitting: Registered Nurse

## 2020-04-23 DIAGNOSIS — F411 Generalized anxiety disorder: Secondary | ICD-10-CM

## 2020-05-04 ENCOUNTER — Other Ambulatory Visit: Payer: Self-pay | Admitting: Registered Nurse

## 2020-05-04 DIAGNOSIS — F410 Panic disorder [episodic paroxysmal anxiety] without agoraphobia: Secondary | ICD-10-CM

## 2020-05-04 NOTE — Telephone Encounter (Signed)
Patient is requesting a refill of the following medications: Requested Prescriptions   Pending Prescriptions Disp Refills   ALPRAZolam (NIRAVAM) 0.5 MG dissolvable tablet [Pharmacy Med Name: ALPRAZolam 0.5 MG Oral Tablet Disintegrating] 30 tablet 0    Sig: DISSOLVE 1 TABLET BY MOUTH AT BEDTIME AS NEEDED FOR ANXIETY    Date of patient request: 05/04/2020 Last office visit: 01/11/20 Date of last refill: 01/17/20 Last refill amount: 30 Follow up time period per chart: none

## 2020-06-02 ENCOUNTER — Emergency Department (HOSPITAL_COMMUNITY)
Admission: EM | Admit: 2020-06-02 | Discharge: 2020-06-02 | Disposition: A | Discharge status: Home / Self Care | Payer: BC Managed Care – PPO | Attending: Emergency Medicine | Admitting: Emergency Medicine

## 2020-06-02 ENCOUNTER — Emergency Department (HOSPITAL_COMMUNITY): Payer: BC Managed Care – PPO

## 2020-06-02 DIAGNOSIS — Y92511 Restaurant or cafe as the place of occurrence of the external cause: Secondary | ICD-10-CM | POA: Insufficient documentation

## 2020-06-02 DIAGNOSIS — M7989 Other specified soft tissue disorders: Secondary | ICD-10-CM | POA: Diagnosis not present

## 2020-06-02 DIAGNOSIS — Y999 Unspecified external cause status: Secondary | ICD-10-CM | POA: Diagnosis not present

## 2020-06-02 DIAGNOSIS — T07XXXA Unspecified multiple injuries, initial encounter: Secondary | ICD-10-CM | POA: Diagnosis not present

## 2020-06-02 DIAGNOSIS — S21301A Unspecified open wound of right front wall of thorax with penetration into thoracic cavity, initial encounter: Secondary | ICD-10-CM | POA: Diagnosis not present

## 2020-06-02 DIAGNOSIS — Y9389 Activity, other specified: Secondary | ICD-10-CM | POA: Insufficient documentation

## 2020-06-02 DIAGNOSIS — S299XXA Unspecified injury of thorax, initial encounter: Secondary | ICD-10-CM | POA: Diagnosis not present

## 2020-06-02 DIAGNOSIS — R0902 Hypoxemia: Secondary | ICD-10-CM | POA: Diagnosis not present

## 2020-06-02 DIAGNOSIS — S2190XA Unspecified open wound of unspecified part of thorax, initial encounter: Secondary | ICD-10-CM | POA: Diagnosis not present

## 2020-06-02 DIAGNOSIS — R0789 Other chest pain: Secondary | ICD-10-CM | POA: Insufficient documentation

## 2020-06-02 DIAGNOSIS — W3400XA Accidental discharge from unspecified firearms or gun, initial encounter: Secondary | ICD-10-CM | POA: Diagnosis not present

## 2020-06-02 DIAGNOSIS — S21101A Unspecified open wound of right front wall of thorax without penetration into thoracic cavity, initial encounter: Secondary | ICD-10-CM | POA: Diagnosis not present

## 2020-06-02 DIAGNOSIS — R918 Other nonspecific abnormal finding of lung field: Secondary | ICD-10-CM | POA: Diagnosis not present

## 2020-06-02 DIAGNOSIS — I10 Essential (primary) hypertension: Secondary | ICD-10-CM | POA: Diagnosis not present

## 2020-06-02 DIAGNOSIS — S21311A Laceration without foreign body of right front wall of thorax with penetration into thoracic cavity, initial encounter: Secondary | ICD-10-CM | POA: Diagnosis not present

## 2020-06-02 DIAGNOSIS — R000 Tachycardia, unspecified: Secondary | ICD-10-CM | POA: Diagnosis not present

## 2020-06-02 LAB — CBC WITH DIFFERENTIAL/PLATELET
Abs Immature Granulocytes: 0.06 10*3/uL (ref 0.00–0.07)
Basophils Absolute: 0 10*3/uL (ref 0.0–0.1)
Basophils Relative: 1 %
Eosinophils Absolute: 0.2 10*3/uL (ref 0.0–0.5)
Eosinophils Relative: 2 %
HCT: 42.1 % (ref 39.0–52.0)
Hemoglobin: 13.9 g/dL (ref 13.0–17.0)
Immature Granulocytes: 1 %
Lymphocytes Relative: 52 %
Lymphs Abs: 4.5 10*3/uL — ABNORMAL HIGH (ref 0.7–4.0)
MCH: 30.6 pg (ref 26.0–34.0)
MCHC: 33 g/dL (ref 30.0–36.0)
MCV: 92.7 fL (ref 80.0–100.0)
Monocytes Absolute: 0.7 10*3/uL (ref 0.1–1.0)
Monocytes Relative: 8 %
Neutro Abs: 3.1 10*3/uL (ref 1.7–7.7)
Neutrophils Relative %: 36 %
Platelets: 288 10*3/uL (ref 150–400)
RBC: 4.54 MIL/uL (ref 4.22–5.81)
RDW: 12.3 % (ref 11.5–15.5)
WBC: 8.6 10*3/uL (ref 4.0–10.5)
nRBC: 0 % (ref 0.0–0.2)

## 2020-06-02 LAB — PROTIME-INR
INR: 1 (ref 0.8–1.2)
Prothrombin Time: 12.6 seconds (ref 11.4–15.2)

## 2020-06-02 LAB — COMPREHENSIVE METABOLIC PANEL
ALT: 45 U/L — ABNORMAL HIGH (ref 0–44)
AST: 31 U/L (ref 15–41)
Albumin: 4.2 g/dL (ref 3.5–5.0)
Alkaline Phosphatase: 57 U/L (ref 38–126)
Anion gap: 15 (ref 5–15)
BUN: 9 mg/dL (ref 6–20)
CO2: 18 mmol/L — ABNORMAL LOW (ref 22–32)
Calcium: 9.3 mg/dL (ref 8.9–10.3)
Chloride: 107 mmol/L (ref 98–111)
Creatinine, Ser: 1.16 mg/dL (ref 0.61–1.24)
GFR calc Af Amer: >60 mL/min (ref >60)
GFR calc non Af Amer: >60 mL/min (ref >60)
Glucose, Bld: 138 mg/dL — ABNORMAL HIGH (ref 70–99)
Potassium: 3.4 mmol/L — ABNORMAL LOW (ref 3.5–5.1)
Sodium: 140 mmol/L (ref 135–145)
Total Bilirubin: 0.8 mg/dL (ref 0.3–1.2)
Total Protein: 7 g/dL (ref 6.5–8.1)

## 2020-06-02 MED ORDER — IOHEXOL 300 MG/ML  SOLN
100.0000 mL | Freq: Once | INTRAMUSCULAR | Status: AC | PRN
  Administered 2020-06-02: 100 mL via INTRAVENOUS
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
## 2020-06-13 ENCOUNTER — Encounter: Payer: Self-pay | Admitting: Registered Nurse

## 2020-06-13 ENCOUNTER — Ambulatory Visit (INDEPENDENT_AMBULATORY_CARE_PROVIDER_SITE_OTHER): Payer: BC Managed Care – PPO | Admitting: Registered Nurse

## 2020-06-13 ENCOUNTER — Other Ambulatory Visit: Payer: Self-pay

## 2020-06-13 DIAGNOSIS — S21201A Unspecified open wound of right back wall of thorax without penetration into thoracic cavity, initial encounter: Secondary | ICD-10-CM

## 2020-06-14 ENCOUNTER — Encounter: Payer: Self-pay | Admitting: Registered Nurse
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
## 2020-06-18 ENCOUNTER — Ambulatory Visit (INDEPENDENT_AMBULATORY_CARE_PROVIDER_SITE_OTHER): Payer: BC Managed Care – PPO | Admitting: Registered Nurse

## 2020-06-18 ENCOUNTER — Encounter: Payer: Self-pay | Admitting: Registered Nurse

## 2020-06-18 ENCOUNTER — Other Ambulatory Visit: Payer: Self-pay

## 2020-06-18 VITALS — BP 146/90 | HR 100 | Temp 98.0°F | Ht 71.0 in | Wt 191.6 lb

## 2020-06-18 DIAGNOSIS — G479 Sleep disorder, unspecified: Secondary | ICD-10-CM | POA: Diagnosis not present

## 2020-06-18 DIAGNOSIS — F410 Panic disorder [episodic paroxysmal anxiety] without agoraphobia: Secondary | ICD-10-CM | POA: Diagnosis not present

## 2020-06-18 MED ORDER — ESCITALOPRAM OXALATE 20 MG PO TABS: 20.0000 mg | ORAL_TABLET | Freq: Every day | ORAL | 0 refills | Status: DC

## 2020-06-18 MED ORDER — TRAZODONE HCL 50 MG PO TABS
25.0000 mg | ORAL_TABLET | Freq: Every evening | ORAL | 3 refills | Status: DC | PRN
Start: 2020-06-18 — End: 2021-02-14

## 2020-06-18 MED ORDER — ALPRAZOLAM 0.5 MG PO TBDP: ORAL_TABLET | ORAL | 0 refills | Status: AC

## 2020-06-18 NOTE — Patient Instructions (Signed)
° ° ° °  If you have lab work done today you will be contacted with your lab results within the next 2 weeks.  If you have not heard from us then please contact us. The fastest way to get your results is to register for My Chart. ° ° °IF you received an x-ray today, you will receive an invoice from [facility]. Please contact [facility] at [phone] with questions or concerns regarding your invoice.  ° °IF you received labwork today, you will receive an invoice from [facility]. Please contact [facility] at [phone] with questions or concerns regarding your invoice.  ° °Our billing staff will not be able to assist you with questions regarding bills from these companies. ° °You will be contacted with the lab results as soon as they are available. The fastest way to get your results is to activate your My Chart account. Instructions are located on the last page of this paperwork. If you have not heard from us regarding the results in 2 weeks, please contact this office. °  ° ° ° °
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
## 2020-06-19 ENCOUNTER — Telehealth: Payer: Self-pay | Admitting: Registered Nurse

## 2020-06-19 NOTE — Telephone Encounter (Signed)
I will look into the box and see if I can find this paperwork to fill out for the patient.

## 2020-06-19 NOTE — Telephone Encounter (Signed)
Received a fax from [facility]. For the provider to fill out. Paperwork is in providers box beside nurses station. Please advise.

## 2020-06-26 NOTE — Telephone Encounter (Signed)
I have not - will check before leaving today  Thanks  Jari Sportsman, NP

## 2020-06-26 NOTE — Telephone Encounter (Signed)
Have either of you located this form? If so have this form been filled out yet? Please let me know so I can call this patient and give them the update on this form? If you have not received this for please let me know as well so I can locate this form or have patient to send [place] another form

## 2020-06-26 NOTE — Telephone Encounter (Signed)
Patient is calling regarding this request.

## 2020-06-27 ENCOUNTER — Encounter: Payer: Self-pay | Admitting: Registered Nurse

## 2020-06-27 NOTE — Telephone Encounter (Signed)
We now have this paperwork and will start on it today

## 2020-06-29 NOTE — Telephone Encounter (Signed)
Attempted to call patient to let him know that the paperwork was faxed to [employer].

## 2020-09-18 ENCOUNTER — Encounter: Payer: Self-pay | Admitting: Registered Nurse

## 2020-09-18 ENCOUNTER — Other Ambulatory Visit: Payer: Self-pay

## 2020-09-18 ENCOUNTER — Ambulatory Visit (INDEPENDENT_AMBULATORY_CARE_PROVIDER_SITE_OTHER): Payer: BC Managed Care – PPO | Admitting: Registered Nurse

## 2020-09-18 ENCOUNTER — Other Ambulatory Visit (HOSPITAL_COMMUNITY)
Admission: RE | Admit: 2020-09-18 | Discharge: 2020-09-18 | Disposition: A | Discharge status: Home / Self Care | Payer: BC Managed Care – PPO | Source: Ambulatory Visit | Attending: Registered Nurse | Admitting: Registered Nurse

## 2020-09-18 VITALS — BP 134/93 | HR 73 | Temp 98.0°F | Resp 18 | Ht 71.0 in | Wt 194.2 lb

## 2020-09-18 DIAGNOSIS — Z113 Encounter for screening for infections with a predominantly sexual mode of transmission: Secondary | ICD-10-CM

## 2020-09-18 NOTE — Patient Instructions (Signed)
° ° ° °  If you have lab work done today you will be contacted with your lab results within the next 2 weeks.  If you have not heard from us then please contact us. The fastest way to get your results is to register for My Chart. ° ° °IF you received an x-ray today, you will receive an invoice from [facility]. Please contact [facility] at [phone] with questions or concerns regarding your invoice.  ° °IF you received labwork today, you will receive an invoice from [facility]. Please contact [facility] at [phone] with questions or concerns regarding your invoice.  ° °Our billing staff will not be able to assist you with questions regarding bills from these companies. ° °You will be contacted with the lab results as soon as they are available. The fastest way to get your results is to activate your My Chart account. Instructions are located on the last page of this paperwork. If you have not heard from us regarding the results in 2 weeks, please contact this office. °  ° ° ° °

## 2020-09-19 LAB — URINE CYTOLOGY ANCILLARY ONLY
Chlamydia: NEGATIVE
Comment: NEGATIVE
Comment: NORMAL
Neisseria Gonorrhea: NEGATIVE

## 2020-09-21 LAB — HIV ANTIBODY (ROUTINE TESTING W REFLEX): HIV Screen 4th Generation wRfx: NONREACTIVE

## 2020-09-21 LAB — RPR: RPR Ser Ql: NONREACTIVE

## 2020-09-21 LAB — HEPATITIS C ANTIBODY: Hep C Virus Ab: <0.1 s/co ratio (ref 0.0–0.9)

## 2020-10-01 ENCOUNTER — Telehealth: Payer: Self-pay | Admitting: Registered Nurse

## 2020-10-01 NOTE — Telephone Encounter (Signed)
What is the name of the medication? escitalopram (LEXAPRO) 20 MG tablet [[id number] in combination with Buspar.  Have you contacted your pharmacy to request a refill? Yes he needs a refill and new script for these. He said the two of you talked about this on his last dos.    Which pharmacy would you like this sent to? [facility] on [address]    Patient notified that their request is being sent to the clinical staff for review and that they should receive a call once it is complete. If they do not receive a call within 72 hours they can check with their pharmacy or our office.

## 2020-10-02 NOTE — Telephone Encounter (Signed)
Please advise on this medication. I do no see any documentation that you and patient had discuss this at office visit.

## 2020-10-05 ENCOUNTER — Other Ambulatory Visit: Payer: Self-pay | Admitting: Registered Nurse

## 2020-10-05 DIAGNOSIS — F410 Panic disorder [episodic paroxysmal anxiety] without agoraphobia: Secondary | ICD-10-CM

## 2020-10-05 MED ORDER — ESCITALOPRAM OXALATE 20 MG PO TABS: 20.0000 mg | ORAL_TABLET | Freq: Every day | ORAL | 3 refills | Status: DC

## 2020-10-05 MED ORDER — BUSPIRONE HCL 7.5 MG PO TABS: 7.5000 mg | ORAL_TABLET | Freq: Three times a day (TID) | ORAL | 3 refills | Status: DC

## 2020-10-23 ENCOUNTER — Telehealth: Payer: Self-pay | Admitting: Registered Nurse

## 2020-10-23 NOTE — Telephone Encounter (Signed)
Received paperwork. Digital copy is scanned to e-mail. Hardcopy is in mailbox upstairs.

## 2020-10-23 NOTE — Telephone Encounter (Signed)
Received a fax on Tuesday 10/23/20 for pts FMLA Certification. Will leave with Jose Mccullough or on her desk. Will look to see if paperwork is being refilled or if it is a first time file to charge $15. Please advise.

## 2020-12-20 ENCOUNTER — Encounter: Payer: Self-pay | Admitting: Registered Nurse

## 2020-12-20 NOTE — Progress Notes (Signed)
Established Patient Office Visit  Subjective:  Patient ID: Jose Mccullough, male    DOB: April 10, 1986  Age: 35 y.o. MRN: [id number]  CC: No chief complaint on file.   HPI Jose Mccullough presents for sti screening  No known exposure, no symptoms Peace of mind screening.   Past Medical History:  Diagnosis Date  . Anxiety     No past surgical history on file.  Family History  Problem Relation Age of Onset  . Diabetes Maternal Grandmother     Social History   Socioeconomic History  . Marital status: Single    Spouse name: Not on file  . Number of children: 2  . Years of education: Not on file  . Highest education level: Not on file  Occupational History  . Not on file  Tobacco Use  . Smoking status: Former Smoker    Years: 13.00  . Smokeless tobacco: Never Used  Vaping Use  . Vaping Use: Never used  Substance and Sexual Activity  . Alcohol use: Yes    Comment: occasional  . Drug use: No  . Sexual activity: Not on file  Other Topics Concern  . Not on file  Social History Narrative  . Not on file   Social Determinants of Health   Financial Resource Strain: Not on file  Food Insecurity: Not on file  Transportation Needs: Not on file  Physical Activity: Not on file  Stress: Not on file  Social Connections: Not on file  Intimate Partner Violence: Not on file    Outpatient Medications Prior to Visit  Medication Sig Dispense Refill  . ALPRAZolam (NIRAVAM) 0.5 MG dissolvable tablet DISSOLVE 1 TABLET BY MOUTH AT BEDTIME AS NEEDED FOR ANXIETY 30 tablet 0  . traZODone (DESYREL) 50 MG tablet Take 0.5-1 tablets (25-50 mg total) by mouth at bedtime as needed for sleep. 30 tablet 3  . escitalopram (LEXAPRO) 20 MG tablet Take 1 tablet (20 mg total) by mouth daily. 90 tablet 0   No facility-administered medications prior to visit.    Allergies  Allergen Reactions  . Ativan [Lorazepam]     Caused depression  . Penicillins     CHILDHOOD    ROS Review of Systems   Constitutional: Negative.  Negative for activity change, appetite change, chills, diaphoresis, fatigue, fever and unexpected weight change.  HENT: Negative.   Eyes: Negative.   Respiratory: Negative.   Cardiovascular: Negative.   Gastrointestinal: Negative.   Genitourinary: Negative.  Negative for decreased urine volume, difficulty urinating, dysuria, enuresis, flank pain, frequency, genital sores, hematuria, penile discharge, penile pain, penile swelling, scrotal swelling, testicular pain and urgency.  Musculoskeletal: Negative.   Skin: Negative.  Negative for rash.  Neurological: Negative.   Psychiatric/Behavioral: Negative.   All other systems reviewed and are negative.     Objective:    Physical Exam Constitutional:      General: He is not in acute distress.    Appearance: Normal appearance. He is normal weight. He is not ill-appearing, toxic-appearing or diaphoretic.  Cardiovascular:     Rate and Rhythm: Normal rate and regular rhythm.     Heart sounds: Normal heart sounds. No murmur heard. No friction rub. No gallop.   Pulmonary:     Effort: Pulmonary effort is normal. No respiratory distress.     Breath sounds: Normal breath sounds. No stridor. No wheezing, rhonchi or rales.  Chest:     Chest wall: No tenderness.  Neurological:     General: No focal deficit present.  Mental Status: He is alert and oriented to person, place, and time. Mental status is at baseline.  Psychiatric:        Mood and Affect: Mood normal.        Behavior: Behavior normal.        Thought Content: Thought content normal.        Judgment: Judgment normal.     BP (!) 134/93   Pulse 73   Temp 98 F (36.7 C) (Temporal)   Resp 18   Ht 5\' 11"  (1.803 m)   Wt 194 lb 3.2 oz (88.1 kg)   SpO2 100%   BMI 27.09 kg/m  Wt Readings from Last 3 Encounters:  09/18/20 194 lb 3.2 oz (88.1 kg)  06/18/20 191 lb 9.6 oz (86.9 kg)  06/13/20 194 lb (88 kg)     Health Maintenance Due  Topic Date Due   . COVID-19 Vaccine (1) Never done    There are no preventive care reminders to display for this patient.  Lab Results  Component Value Date   TSH 1.370 01/11/2020   Lab Results  Component Value Date   WBC 8.6 06/02/2020   HGB 13.9 06/02/2020   HCT 42.1 06/02/2020   MCV 92.7 06/02/2020   PLT 288 06/02/2020   Lab Results  Component Value Date   NA 140 06/02/2020   K 3.4 (L) 06/02/2020   CO2 18 (L) 06/02/2020   GLUCOSE 138 (H) 06/02/2020   BUN 9 06/02/2020   CREATININE 1.16 06/02/2020   BILITOT 0.8 06/02/2020   ALKPHOS 57 06/02/2020   AST 31 06/02/2020   ALT 45 (H) 06/02/2020   PROT 7.0 06/02/2020   ALBUMIN 4.2 06/02/2020   CALCIUM 9.3 06/02/2020   ANIONGAP 15 06/02/2020   Lab Results  Component Value Date   CHOL 186 01/11/2020   Lab Results  Component Value Date   HDL 37 (L) 01/11/2020   Lab Results  Component Value Date   LDLCALC 63 01/11/2020   Lab Results  Component Value Date   TRIG 568 (HH) 01/11/2020   Lab Results  Component Value Date   CHOLHDL 5.0 01/11/2020   Lab Results  Component Value Date   HGBA1C 5.9 (H) 01/11/2020      Assessment & Plan:   Problem List Items Addressed This Visit   None   Visit Diagnoses    Screen for STD (sexually transmitted disease)    -  Primary   Relevant Orders   HIV antibody (with reflex) (Completed)   Hepatitis C antibody (Completed)   Urine cytology ancillary only (Completed)   RPR (Completed)      No orders of the defined types were placed in this encounter.   Follow-up: No follow-ups on file.   PLAN Labs collected. Will follow up with the patient as warranted. Return q3-6 mo for screening or with new partners Discussed incubation periods, safe sex, and transmission with pt Patient encouraged to call clinic with any questions, comments, or concerns.  01/13/2020, NP

## 2020-12-26 ENCOUNTER — Ambulatory Visit: Payer: BC Managed Care – PPO | Admitting: Registered Nurse

## 2020-12-28 ENCOUNTER — Encounter: Payer: Self-pay | Admitting: Registered Nurse

## 2020-12-28 ENCOUNTER — Other Ambulatory Visit: Payer: Self-pay

## 2020-12-28 ENCOUNTER — Ambulatory Visit (INDEPENDENT_AMBULATORY_CARE_PROVIDER_SITE_OTHER): Payer: BC Managed Care – PPO | Admitting: Registered Nurse

## 2020-12-28 VITALS — BP 137/87 | HR 88 | Temp 98.3°F | Resp 16 | Ht 71.0 in | Wt 192.2 lb

## 2020-12-28 DIAGNOSIS — F410 Panic disorder [episodic paroxysmal anxiety] without agoraphobia: Secondary | ICD-10-CM | POA: Diagnosis not present

## 2020-12-28 DIAGNOSIS — F411 Generalized anxiety disorder: Secondary | ICD-10-CM

## 2020-12-28 DIAGNOSIS — G479 Sleep disorder, unspecified: Secondary | ICD-10-CM

## 2020-12-28 MED ORDER — HYDROXYZINE HCL 25 MG PO TABS: 25.0000 mg | ORAL_TABLET | Freq: Every evening | ORAL | 0 refills | Status: DC | PRN

## 2020-12-28 MED ORDER — BUSPIRONE HCL 15 MG PO TABS: 15.0000 mg | ORAL_TABLET | Freq: Three times a day (TID) | ORAL | 0 refills | Status: DC

## 2020-12-28 MED ORDER — BUPROPION HCL ER (SR) 100 MG PO TB12: ORAL_TABLET | ORAL | 0 refills | Status: DC

## 2020-12-28 NOTE — Progress Notes (Signed)
Established Patient Office Visit  Subjective:  Patient ID: Jose Mccullough, male    DOB: [birth year]  Age: 35 y.o. MRN: [id number]  CC:  Chief Complaint  Patient presents with  . Depression    Pt reports the medication doesn't seem to work well and would like to discuss change in dose or medication     HPI Jose Mccullough presents for depression/anxiety  Ongoing, unfortunately lexapro ineffective with good compliance Admits using alcohol to cope- particularly with anxiety. Drinks 3-4 times each week, usually not to excess. Has not used eye opener, been confronted about a problem, or demonstrated any other problem behavior.  Has been on prozac and effexor in the past. Notes that while they did help at times, did not continue to have therapeutic effect. Has been on other medications, thinks zoloft, but unsure of effect.  No new symptoms or concerns. Ongoing sleep disturbance.  Past Medical History:  Diagnosis Date  . Anxiety     No past surgical history on file.  Family History  Problem Relation Age of Onset  . Diabetes Maternal Grandmother     Social History   Socioeconomic History  . Marital status: Single    Spouse name: Not on file  . Number of children: 2  . Years of education: Not on file  . Highest education level: Not on file  Occupational History  . Not on file  Tobacco Use  . Smoking status: Former Smoker    Years: 13.00  . Smokeless tobacco: Never Used  Vaping Use  . Vaping Use: Never used  Substance and Sexual Activity  . Alcohol use: Yes    Comment: occasional  . Drug use: No  . Sexual activity: Not on file  Other Topics Concern  . Not on file  Social History Narrative  . Not on file   Social Determinants of Health   Financial Resource Strain: Not on file  Food Insecurity: Not on file  Transportation Needs: Not on file  Physical Activity: Not on file  Stress: Not on file  Social Connections: Not on file  Intimate Partner Violence: Not on  file    Outpatient Medications Prior to Visit  Medication Sig Dispense Refill  . ALPRAZolam (NIRAVAM) 0.5 MG dissolvable tablet DISSOLVE 1 TABLET BY MOUTH AT BEDTIME AS NEEDED FOR ANXIETY 30 tablet 0  . traZODone (DESYREL) 50 MG tablet Take 0.5-1 tablets (25-50 mg total) by mouth at bedtime as needed for sleep. 30 tablet 3  . busPIRone (BUSPAR) 7.5 MG tablet Take 1 tablet (7.5 mg total) by mouth 3 (three) times daily. 90 tablet 3  . escitalopram (LEXAPRO) 20 MG tablet Take 1 tablet (20 mg total) by mouth daily. 90 tablet 3   No facility-administered medications prior to visit.    Allergies  Allergen Reactions  . Ativan [Lorazepam]     Caused depression  . Penicillins     CHILDHOOD    ROS Review of Systems  Constitutional: Negative.   HENT: Negative.   Eyes: Negative.   Respiratory: Negative.   Cardiovascular: Negative.   Gastrointestinal: Negative.   Genitourinary: Negative.   Musculoskeletal: Negative.   Skin: Negative.   Neurological: Negative.   Psychiatric/Behavioral: Negative.   All other systems reviewed and are negative.     Objective:    Physical Exam Constitutional:      General: He is not in acute distress.    Appearance: Normal appearance. He is normal weight. He is not ill-appearing, toxic-appearing or diaphoretic.  Cardiovascular:  Rate and Rhythm: Normal rate and regular rhythm.     Heart sounds: Normal heart sounds. No murmur heard. No friction rub. No gallop.   Pulmonary:     Effort: Pulmonary effort is normal. No respiratory distress.     Breath sounds: Normal breath sounds. No stridor. No wheezing, rhonchi or rales.  Chest:     Chest wall: No tenderness.  Neurological:     General: No focal deficit present.     Mental Status: He is alert and oriented to person, place, and time. Mental status is at baseline.  Psychiatric:        Mood and Affect: Mood normal.        Behavior: Behavior normal.        Thought Content: Thought content normal.         Judgment: Judgment normal.     BP 137/87   Pulse 88   Temp 98.3 F (36.8 C) (Temporal)   Resp 16   Ht 5\' 11"  (1.803 m)   Wt 192 lb 3.2 oz (87.2 kg)   SpO2 99%   BMI 26.81 kg/m  Wt Readings from Last 3 Encounters:  12/28/20 192 lb 3.2 oz (87.2 kg)  09/18/20 194 lb 3.2 oz (88.1 kg)  06/18/20 191 lb 9.6 oz (86.9 kg)     Health Maintenance Due  Topic Date Due  . COVID-19 Vaccine (1) Never done    There are no preventive care reminders to display for this patient.  Lab Results  Component Value Date   TSH 1.370 01/11/2020   Lab Results  Component Value Date   WBC 8.6 06/02/2020   HGB 13.9 06/02/2020   HCT 42.1 06/02/2020   MCV 92.7 06/02/2020   PLT 288 06/02/2020   Lab Results  Component Value Date   NA 140 06/02/2020   K 3.4 (L) 06/02/2020   CO2 18 (L) 06/02/2020   GLUCOSE 138 (H) 06/02/2020   BUN 9 06/02/2020   CREATININE 1.16 06/02/2020   BILITOT 0.8 06/02/2020   ALKPHOS 57 06/02/2020   AST 31 06/02/2020   ALT 45 (H) 06/02/2020   PROT 7.0 06/02/2020   ALBUMIN 4.2 06/02/2020   CALCIUM 9.3 06/02/2020   ANIONGAP 15 06/02/2020   Lab Results  Component Value Date   CHOL 186 01/11/2020   Lab Results  Component Value Date   HDL 37 (L) 01/11/2020   Lab Results  Component Value Date   LDLCALC 63 01/11/2020   Lab Results  Component Value Date   TRIG 568 (HH) 01/11/2020   Lab Results  Component Value Date   CHOLHDL 5.0 01/11/2020   Lab Results  Component Value Date   HGBA1C 5.9 (H) 01/11/2020      Assessment & Plan:   Problem List Items Addressed This Visit   None   Visit Diagnoses    GAD (generalized anxiety disorder)    -  Primary   Relevant Medications   buPROPion (WELLBUTRIN SR) 100 MG 12 hr tablet   hydrOXYzine (ATARAX/VISTARIL) 25 MG tablet   busPIRone (BUSPAR) 15 MG tablet   Other Relevant Orders   Ambulatory referral to Psychiatry   Panic attacks       Relevant Medications   buPROPion (WELLBUTRIN SR) 100 MG 12 hr  tablet   hydrOXYzine (ATARAX/VISTARIL) 25 MG tablet   busPIRone (BUSPAR) 15 MG tablet   Other Relevant Orders   Ambulatory referral to Psychiatry   Sleep disturbance       Relevant Orders  Ambulatory referral to Psychiatry      Meds ordered this encounter  Medications  . buPROPion (WELLBUTRIN SR) 100 MG 12 hr tablet    Sig: Take 1 tablet (100mg ) daily in the mornings for one week. Then increase to 1 tablet (100mg ) in the mornings and 1 tablet (100mg ) in the evenings.    Dispense:  90 tablet    Refill:  0    Order Specific Question:   Supervising Provider    Answer:   , JEFFREY R [[id number]]  . hydrOXYzine (ATARAX/VISTARIL) 25 MG tablet    Sig: Take 1-2 tablets (25-50 mg total) by mouth at bedtime as needed.    Dispense:  30 tablet    Refill:  0    Order Specific Question:   Supervising Provider    Answer:   , JEFFREY R [[id number]]  . busPIRone (BUSPAR) 15 MG tablet    Sig: Take 1 tablet (15 mg total) by mouth 3 (three) times daily.    Dispense:  90 tablet    Refill:  0    Order Specific Question:   Supervising Provider    Answer:   , JEFFREY R [[id number]]    Follow-up: No follow-ups on file.   PLAN  Will start bupropion 100mg  XR po qd, increase to bid after 1 week if tolerated.  Increase buspar  Switch trazodone to hydroxyzine 25-50mg  PO qhs PRN  Med check 6 weeks after starting wellbutrin-  May start right away or after lexapro  Taper lexapro with half dose for one week then stop. Can do 5mg  dose for additional week if desired / concerned about AEs.  Patient encouraged to call clinic with any questions, comments, or concerns.  Neva Seat, NP

## 2020-12-28 NOTE — Patient Instructions (Signed)
° ° ° °  If you have lab work done today you will be contacted with your lab results within the next 2 weeks.  If you have not heard from us then please contact us. The fastest way to get your results is to register for My Chart. ° ° °IF you received an x-ray today, you will receive an invoice from [facility]. Please contact [facility] at [phone] with questions or concerns regarding your invoice.  ° °IF you received labwork today, you will receive an invoice from [facility]. Please contact [facility] at [phone] with questions or concerns regarding your invoice.  ° °Our billing staff will not be able to assist you with questions regarding bills from these companies. ° °You will be contacted with the lab results as soon as they are available. The fastest way to get your results is to activate your My Chart account. Instructions are located on the last page of this paperwork. If you have not heard from us regarding the results in 2 weeks, please contact this office. °  ° ° ° °

## 2021-01-02 NOTE — Telephone Encounter (Signed)
Patient would like to know what to do with his old medications

## 2021-01-04 ENCOUNTER — Other Ambulatory Visit: Payer: Self-pay | Admitting: Registered Nurse

## 2021-01-04 DIAGNOSIS — F411 Generalized anxiety disorder: Secondary | ICD-10-CM
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
## 2021-01-04 NOTE — Telephone Encounter (Signed)
Please Advise  Pharmacy comment: REQUEST FOR 90 DAYS PRESCRIPTION. DX Code Needed.  Patient is requesting a refill of the following medications: Requested Prescriptions   Pending Prescriptions Disp Refills   hydrOXYzine (ATARAX/VISTARIL) 25 MG tablet [Pharmacy Med Name: HYDROXYZINE HCL 25 MG TABLET] 180 tablet 1    Sig: TAKE 1-2 TABLETS (25-50 MG TOTAL) BY MOUTH AT BEDTIME AS NEEDED.    Date of patient request: 01/04/21 Last office visit:  Date of last refill: 12/28/20 Last refill amount: 30 Follow up time period per chart:

## 2021-01-19 ENCOUNTER — Other Ambulatory Visit: Payer: Self-pay | Admitting: Registered Nurse

## 2021-01-19 DIAGNOSIS — F411 Generalized anxiety disorder: Secondary | ICD-10-CM

## 2021-02-03 ENCOUNTER — Other Ambulatory Visit: Payer: Self-pay | Admitting: Registered Nurse

## 2021-02-03 DIAGNOSIS — F411 Generalized anxiety disorder: Secondary | ICD-10-CM

## 2021-02-10 ENCOUNTER — Other Ambulatory Visit: Payer: Self-pay

## 2021-02-10 ENCOUNTER — Emergency Department (HOSPITAL_COMMUNITY): Payer: BC Managed Care – PPO

## 2021-02-10 ENCOUNTER — Encounter (HOSPITAL_COMMUNITY): Payer: Self-pay | Admitting: Emergency Medicine

## 2021-02-10 ENCOUNTER — Emergency Department (HOSPITAL_COMMUNITY)
Admission: EM | Admit: 2021-02-10 | Discharge: 2021-02-10 | Disposition: A | Discharge status: Home / Self Care | Payer: BC Managed Care – PPO | Attending: Emergency Medicine | Admitting: Emergency Medicine

## 2021-02-10 DIAGNOSIS — K432 Incisional hernia without obstruction or gangrene: Secondary | ICD-10-CM | POA: Diagnosis not present

## 2021-02-10 DIAGNOSIS — S81812A Laceration without foreign body, left lower leg, initial encounter: Secondary | ICD-10-CM

## 2021-02-10 DIAGNOSIS — S199XXA Unspecified injury of neck, initial encounter: Secondary | ICD-10-CM | POA: Diagnosis not present

## 2021-02-10 DIAGNOSIS — S0083XA Contusion of other part of head, initial encounter: Secondary | ICD-10-CM | POA: Diagnosis not present

## 2021-02-10 DIAGNOSIS — S0231XA Fracture of orbital floor, right side, initial encounter for closed fracture: Secondary | ICD-10-CM | POA: Insufficient documentation

## 2021-02-10 DIAGNOSIS — S01111A Laceration without foreign body of right eyelid and periocular area, initial encounter: Secondary | ICD-10-CM | POA: Diagnosis not present

## 2021-02-10 DIAGNOSIS — S025XXA Fracture of tooth (traumatic), initial encounter for closed fracture: Secondary | ICD-10-CM | POA: Insufficient documentation

## 2021-02-10 DIAGNOSIS — S299XXA Unspecified injury of thorax, initial encounter: Secondary | ICD-10-CM | POA: Diagnosis not present

## 2021-02-10 DIAGNOSIS — S60221A Contusion of right hand, initial encounter: Secondary | ICD-10-CM | POA: Diagnosis not present

## 2021-02-10 DIAGNOSIS — S81011A Laceration without foreign body, right knee, initial encounter: Secondary | ICD-10-CM | POA: Diagnosis not present

## 2021-02-10 DIAGNOSIS — S61411A Laceration without foreign body of right hand, initial encounter: Secondary | ICD-10-CM | POA: Diagnosis not present

## 2021-02-10 DIAGNOSIS — S0285XA Fracture of orbit, unspecified, initial encounter for closed fracture: Secondary | ICD-10-CM

## 2021-02-10 DIAGNOSIS — S41111A Laceration without foreign body of right upper arm, initial encounter: Secondary | ICD-10-CM

## 2021-02-10 DIAGNOSIS — S0990XA Unspecified injury of head, initial encounter: Secondary | ICD-10-CM | POA: Insufficient documentation

## 2021-02-10 DIAGNOSIS — S0231XB Fracture of orbital floor, right side, initial encounter for open fracture: Secondary | ICD-10-CM | POA: Diagnosis not present

## 2021-02-10 DIAGNOSIS — S0292XA Unspecified fracture of facial bones, initial encounter for closed fracture: Secondary | ICD-10-CM | POA: Diagnosis not present

## 2021-02-10 DIAGNOSIS — M542 Cervicalgia: Secondary | ICD-10-CM | POA: Diagnosis not present

## 2021-02-10 DIAGNOSIS — S41119A Laceration without foreign body of unspecified upper arm, initial encounter: Secondary | ICD-10-CM | POA: Diagnosis not present

## 2021-02-10 DIAGNOSIS — S0511XA Contusion of eyeball and orbital tissues, right eye, initial encounter: Secondary | ICD-10-CM | POA: Diagnosis not present

## 2021-02-10 DIAGNOSIS — H0520 Unspecified exophthalmos: Secondary | ICD-10-CM | POA: Diagnosis not present

## 2021-02-10 LAB — BASIC METABOLIC PANEL
Anion gap: 13 (ref 5–15)
BUN: 13 mg/dL (ref 6–20)
CO2: 19 mmol/L — ABNORMAL LOW (ref 22–32)
Calcium: 9.3 mg/dL (ref 8.9–10.3)
Chloride: 109 mmol/L (ref 98–111)
Creatinine, Ser: 1.21 mg/dL (ref 0.61–1.24)
GFR, Estimated: >60 mL/min (ref >60)
Glucose, Bld: 151 mg/dL — ABNORMAL HIGH (ref 70–99)
Potassium: 3.2 mmol/L — ABNORMAL LOW (ref 3.5–5.1)
Sodium: 141 mmol/L (ref 135–145)

## 2021-02-10 LAB — CBC WITH DIFFERENTIAL/PLATELET
Abs Immature Granulocytes: 0.07 10*3/uL (ref 0.00–0.07)
Basophils Absolute: 0 10*3/uL (ref 0.0–0.1)
Basophils Relative: 0 %
Eosinophils Absolute: 0.1 10*3/uL (ref 0.0–0.5)
Eosinophils Relative: 0 %
HCT: 41.2 % (ref 39.0–52.0)
Hemoglobin: 14.2 g/dL (ref 13.0–17.0)
Immature Granulocytes: 1 %
Lymphocytes Relative: 17 %
Lymphs Abs: 2.1 10*3/uL (ref 0.7–4.0)
MCH: 31.6 pg (ref 26.0–34.0)
MCHC: 34.5 g/dL (ref 30.0–36.0)
MCV: 91.6 fL (ref 80.0–100.0)
Monocytes Absolute: 0.7 10*3/uL (ref 0.1–1.0)
Monocytes Relative: 6 %
Neutro Abs: 9.3 10*3/uL — ABNORMAL HIGH (ref 1.7–7.7)
Neutrophils Relative %: 76 %
Platelets: 300 10*3/uL (ref 150–400)
RBC: 4.5 MIL/uL (ref 4.22–5.81)
RDW: 12.5 % (ref 11.5–15.5)
WBC: 12.2 10*3/uL — ABNORMAL HIGH (ref 4.0–10.5)
nRBC: 0 % (ref 0.0–0.2)

## 2021-02-10 MED ORDER — SODIUM CHLORIDE 0.9 % IV BOLUS
1000.0000 mL | Freq: Once | INTRAVENOUS | Status: AC
  Administered 2021-02-10: 1000 mL via INTRAVENOUS

## 2021-02-10 MED ORDER — MORPHINE SULFATE (PF) 4 MG/ML IV SOLN: 4.0000 mg | Freq: Once | INTRAVENOUS | Status: DC

## 2021-02-10 MED ORDER — TETRACAINE HCL 0.5 % OP SOLN
2.0000 [drp] | Freq: Once | OPHTHALMIC | Status: AC
  Administered 2021-02-10: 2 [drp] via OPHTHALMIC
  Filled 2021-02-10: qty 4

## 2021-02-10 MED ORDER — LIDOCAINE HCL (PF) 1 % IJ SOLN
30.0000 mL | Freq: Once | INTRAMUSCULAR | Status: AC
  Administered 2021-02-10: 30 mL via INTRADERMAL
  Filled 2021-02-10: qty 30

## 2021-02-10 MED ORDER — HYDROCODONE-ACETAMINOPHEN 5-325 MG PO TABS: 1.0000 | ORAL_TABLET | Freq: Four times a day (QID) | ORAL | 0 refills | Status: DC | PRN

## 2021-02-10 MED ORDER — CEPHALEXIN 500 MG PO CAPS: 500.0000 mg | ORAL_CAPSULE | Freq: Four times a day (QID) | ORAL | 0 refills | Status: AC

## 2021-02-10 MED ORDER — MORPHINE SULFATE (PF) 4 MG/ML IV SOLN
4.0000 mg | Freq: Once | INTRAVENOUS | Status: AC
  Administered 2021-02-10: 4 mg via INTRAVENOUS
  Filled 2021-02-10: qty 1

## 2021-02-10 NOTE — ED Notes (Signed)
Discharged no concerns 
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
## 2021-02-10 NOTE — Discharge Instructions (Signed)
Apply bacitracin and perform dressing changes twice daily to the lacerations for the next several days.  Apply ice around the eye for 20 minutes every 2 hours while awake for the next 2 days to help with swelling.  Begin taking Keflex as prescribed.  Begin taking hydrocodone as prescribed as needed for pain.  You will need to follow-up with your dentist in the next few days.    Dr. Sherryll Burger from ophthalmology will see you in the office Monday at 3 PM.  Please call Monday morning to confirm this appointment.  His contact information has been provided in this discharge summary.  Follow-up with Dr. Leta Baptist later this week.  Her contact information has been provided in this discharge summary for you to call and make these arrangements.  Your sutures are to be removed from your eyebrow in 7 days.  Please follow-up with your primary doctor for this.    The sutures placed in your arm and leg should be removed in the next 10 to 14 days.  Please follow-up with your primary doctor for this.

## 2021-02-10 NOTE — ED Notes (Signed)
Patient transported to X-ray 
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
## 2021-02-12 DIAGNOSIS — S0230XD Fracture of orbital floor, unspecified side, subsequent encounter for fracture with routine healing: Secondary | ICD-10-CM | POA: Diagnosis not present

## 2021-02-12 DIAGNOSIS — H05221 Edema of right orbit: Secondary | ICD-10-CM | POA: Diagnosis not present

## 2021-02-12 DIAGNOSIS — H11431 Conjunctival hyperemia, right eye: Secondary | ICD-10-CM | POA: Diagnosis not present

## 2021-02-13 DIAGNOSIS — S0231XA Fracture of orbital floor, right side, initial encounter for closed fracture: Secondary | ICD-10-CM | POA: Diagnosis not present
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
## 2021-02-14 ENCOUNTER — Other Ambulatory Visit: Payer: Self-pay

## 2021-02-14 ENCOUNTER — Encounter (HOSPITAL_BASED_OUTPATIENT_CLINIC_OR_DEPARTMENT_OTHER): Payer: Self-pay | Admitting: Plastic Surgery

## 2021-02-14 ENCOUNTER — Other Ambulatory Visit (HOSPITAL_COMMUNITY): Payer: BC Managed Care – PPO

## 2021-02-14 DIAGNOSIS — H209 Unspecified iridocyclitis: Secondary | ICD-10-CM | POA: Diagnosis not present

## 2021-02-15 ENCOUNTER — Other Ambulatory Visit (HOSPITAL_COMMUNITY)
Admission: RE | Admit: 2021-02-15 | Discharge: 2021-02-15 | Disposition: A | Discharge status: Home / Self Care | Payer: BC Managed Care – PPO | Source: Ambulatory Visit | Attending: Plastic Surgery | Admitting: Plastic Surgery

## 2021-02-15 DIAGNOSIS — Z01812 Encounter for preprocedural laboratory examination: Secondary | ICD-10-CM | POA: Diagnosis not present

## 2021-02-15 DIAGNOSIS — Z20822 Contact with and (suspected) exposure to covid-19: Secondary | ICD-10-CM | POA: Insufficient documentation

## 2021-02-15 NOTE — Progress Notes (Signed)
Sent message reminding pt to go for covid test today before 3pm.

## 2021-02-16 LAB — SARS CORONAVIRUS 2 (TAT 6-24 HRS): SARS Coronavirus 2: NEGATIVE

## 2021-02-18 ENCOUNTER — Other Ambulatory Visit: Payer: Self-pay

## 2021-02-18 ENCOUNTER — Ambulatory Visit (HOSPITAL_BASED_OUTPATIENT_CLINIC_OR_DEPARTMENT_OTHER): Payer: BC Managed Care – PPO | Admitting: Certified Registered"

## 2021-02-18 ENCOUNTER — Encounter (HOSPITAL_BASED_OUTPATIENT_CLINIC_OR_DEPARTMENT_OTHER)
Admission: RE | Disposition: A | Discharge status: Home / Self Care | Payer: Self-pay | Source: Ambulatory Visit | Attending: Plastic Surgery

## 2021-02-18 ENCOUNTER — Ambulatory Visit (HOSPITAL_BASED_OUTPATIENT_CLINIC_OR_DEPARTMENT_OTHER)
Admission: RE | Admit: 2021-02-18 | Discharge: 2021-02-19 | Disposition: A | Discharge status: Home / Self Care | Payer: BC Managed Care – PPO | Source: Ambulatory Visit | Attending: Plastic Surgery | Admitting: Plastic Surgery

## 2021-02-18 ENCOUNTER — Encounter (HOSPITAL_BASED_OUTPATIENT_CLINIC_OR_DEPARTMENT_OTHER): Payer: Self-pay | Admitting: Plastic Surgery

## 2021-02-18 DIAGNOSIS — F419 Anxiety disorder, unspecified: Secondary | ICD-10-CM | POA: Diagnosis not present

## 2021-02-18 DIAGNOSIS — S0231XA Fracture of orbital floor, right side, initial encounter for closed fracture: Secondary | ICD-10-CM | POA: Diagnosis present

## 2021-02-18 DIAGNOSIS — F17200 Nicotine dependence, unspecified, uncomplicated: Secondary | ICD-10-CM | POA: Diagnosis not present

## 2021-02-18 HISTORY — PX: ORIF ORBITAL FRACTURE: SHX5312

## 2021-02-18 HISTORY — DX: Fracture of orbital floor, unspecified side, initial encounter for closed fracture: S02.30XA

## 2021-02-18 SURGERY — OPEN REDUCTION INTERNAL FIXATION (ORIF) ORBITAL FRACTURE
Anesthesia: General | Site: Eye | Laterality: Right

## 2021-02-18 MED ORDER — HYDROCODONE-ACETAMINOPHEN 5-325 MG PO TABS
1.0000 | ORAL_TABLET | ORAL | Status: DC | PRN
  Administered 2021-02-18 – 2021-02-19 (×4): 2 via ORAL
  Filled 2021-02-18 (×2): qty 1
  Filled 2021-02-18 (×3): qty 2

## 2021-02-18 MED ORDER — SUGAMMADEX SODIUM 200 MG/2ML IV SOLN
INTRAVENOUS | Status: DC | PRN
  Administered 2021-02-18: 200 mg via INTRAVENOUS

## 2021-02-18 MED ORDER — FENTANYL CITRATE (PF) 100 MCG/2ML IJ SOLN
INTRAMUSCULAR | Status: DC | PRN
  Administered 2021-02-18: 50 ug via INTRAVENOUS
  Administered 2021-02-18: 100 ug via INTRAVENOUS

## 2021-02-18 MED ORDER — BUSPIRONE HCL 15 MG PO TABS
15.0000 mg | ORAL_TABLET | Freq: Three times a day (TID) | ORAL | Status: DC
  Filled 2021-02-18: qty 1

## 2021-02-18 MED ORDER — LIDOCAINE-EPINEPHRINE 1 %-1:100000 IJ SOLN
INTRAMUSCULAR | Status: DC | PRN
  Administered 2021-02-18: 5 mL

## 2021-02-18 MED ORDER — CLINDAMYCIN PHOSPHATE 900 MG/50ML IV SOLN
900.0000 mg | INTRAVENOUS | Status: AC
  Administered 2021-02-18: 900 mg via INTRAVENOUS

## 2021-02-18 MED ORDER — PROPOFOL 10 MG/ML IV BOLUS
INTRAVENOUS | Status: AC
  Filled 2021-02-18: qty 20

## 2021-02-18 MED ORDER — OXYMETAZOLINE HCL 0.05 % NA SOLN
NASAL | Status: AC
  Filled 2021-02-18: qty 30

## 2021-02-18 MED ORDER — MIDAZOLAM HCL 2 MG/2ML IJ SOLN
INTRAMUSCULAR | Status: AC
  Filled 2021-02-18: qty 2

## 2021-02-18 MED ORDER — DEXAMETHASONE SODIUM PHOSPHATE 10 MG/ML IJ SOLN
INTRAMUSCULAR | Status: AC
  Filled 2021-02-18: qty 1

## 2021-02-18 MED ORDER — DEXAMETHASONE SODIUM PHOSPHATE 4 MG/ML IJ SOLN
INTRAMUSCULAR | Status: DC | PRN
  Administered 2021-02-18: 10 mg via INTRAVENOUS

## 2021-02-18 MED ORDER — LIDOCAINE 2% (20 MG/ML) 5 ML SYRINGE
INTRAMUSCULAR | Status: AC
  Filled 2021-02-18: qty 5

## 2021-02-18 MED ORDER — TOBRAMYCIN-DEXAMETHASONE 0.3-0.1 % OP OINT
TOPICAL_OINTMENT | Freq: Four times a day (QID) | OPHTHALMIC | Status: DC
  Filled 2021-02-18: qty 3.5

## 2021-02-18 MED ORDER — FENTANYL CITRATE (PF) 100 MCG/2ML IJ SOLN
25.0000 ug | INTRAMUSCULAR | Status: DC | PRN
  Administered 2021-02-18 (×2): 50 ug via INTRAVENOUS

## 2021-02-18 MED ORDER — ROCURONIUM BROMIDE 10 MG/ML (PF) SYRINGE
PREFILLED_SYRINGE | INTRAVENOUS | Status: AC
  Filled 2021-02-18: qty 10

## 2021-02-18 MED ORDER — ONDANSETRON HCL 4 MG/2ML IJ SOLN: 4.0000 mg | Freq: Four times a day (QID) | INTRAMUSCULAR | Status: DC | PRN

## 2021-02-18 MED ORDER — ALPRAZOLAM 0.5 MG PO TABS
0.5000 mg | ORAL_TABLET | Freq: Three times a day (TID) | ORAL | Status: DC | PRN
  Administered 2021-02-19: 0.5 mg via ORAL
  Filled 2021-02-18: qty 2

## 2021-02-18 MED ORDER — HYDROMORPHONE HCL 1 MG/ML IJ SOLN
0.5000 mg | INTRAMUSCULAR | Status: DC | PRN
  Administered 2021-02-18: 0.5 mg via INTRAVENOUS
  Filled 2021-02-18: qty 0.5

## 2021-02-18 MED ORDER — ONDANSETRON HCL 4 MG/2ML IJ SOLN
INTRAMUSCULAR | Status: AC
  Filled 2021-02-18: qty 2

## 2021-02-18 MED ORDER — BUPROPION HCL ER (SR) 100 MG PO TB12
100.0000 mg | ORAL_TABLET | Freq: Two times a day (BID) | ORAL | Status: DC
  Filled 2021-02-18: qty 1

## 2021-02-18 MED ORDER — ACETAMINOPHEN 500 MG PO TABS
ORAL_TABLET | ORAL | Status: AC
  Filled 2021-02-18: qty 2

## 2021-02-18 MED ORDER — FENTANYL CITRATE (PF) 100 MCG/2ML IJ SOLN
INTRAMUSCULAR | Status: AC
  Filled 2021-02-18: qty 2

## 2021-02-18 MED ORDER — LIDOCAINE-EPINEPHRINE 1 %-1:100000 IJ SOLN
INTRAMUSCULAR | Status: AC
  Filled 2021-02-18: qty 1

## 2021-02-18 MED ORDER — CLINDAMYCIN PHOSPHATE 900 MG/50ML IV SOLN
INTRAVENOUS | Status: AC
  Filled 2021-02-18: qty 50

## 2021-02-18 MED ORDER — DIPHENHYDRAMINE HCL 50 MG/ML IJ SOLN
INTRAMUSCULAR | Status: DC | PRN
  Administered 2021-02-18: 12.5 mg via INTRAVENOUS

## 2021-02-18 MED ORDER — KCL IN DEXTROSE-NACL 20-5-0.45 MEQ/L-%-% IV SOLN
INTRAVENOUS | Status: DC
  Filled 2021-02-18: qty 1000

## 2021-02-18 MED ORDER — ACETAMINOPHEN 500 MG PO TABS
1000.0000 mg | ORAL_TABLET | Freq: Once | ORAL | Status: AC
  Administered 2021-02-18: 1000 mg via ORAL

## 2021-02-18 MED ORDER — TOBRAMYCIN-DEXAMETHASONE 0.3-0.1 % OP OINT
TOPICAL_OINTMENT | OPHTHALMIC | Status: AC
  Filled 2021-02-18: qty 3.5

## 2021-02-18 MED ORDER — ONDANSETRON 4 MG PO TBDP
4.0000 mg | ORAL_TABLET | Freq: Four times a day (QID) | ORAL | Status: DC | PRN
Start: 2021-02-18 — End: 2021-02-19

## 2021-02-18 MED ORDER — ROCURONIUM BROMIDE 100 MG/10ML IV SOLN
INTRAVENOUS | Status: DC | PRN
  Administered 2021-02-18: 80 mg via INTRAVENOUS

## 2021-02-18 MED ORDER — DEXMEDETOMIDINE HCL IN NACL 200 MCG/50ML IV SOLN
INTRAVENOUS | Status: DC | PRN
  Administered 2021-02-18: 8 ug via INTRAVENOUS

## 2021-02-18 MED ORDER — PROPOFOL 10 MG/ML IV BOLUS
INTRAVENOUS | Status: DC | PRN
  Administered 2021-02-18: 200 mg via INTRAVENOUS

## 2021-02-18 MED ORDER — BSS IO SOLN
INTRAOCULAR | Status: AC
  Filled 2021-02-18: qty 15

## 2021-02-18 MED ORDER — LACTATED RINGERS IV SOLN: INTRAVENOUS | Status: DC

## 2021-02-18 MED ORDER — BSS IO SOLN
INTRAOCULAR | Status: DC | PRN
  Administered 2021-02-18: 15 mL

## 2021-02-18 MED ORDER — TOBRAMYCIN-DEXAMETHASONE 0.3-0.1 % OP OINT
TOPICAL_OINTMENT | OPHTHALMIC | Status: DC | PRN
  Administered 2021-02-18: 1 via OPHTHALMIC

## 2021-02-18 MED ORDER — ONDANSETRON HCL 4 MG/2ML IJ SOLN
INTRAMUSCULAR | Status: DC | PRN
  Administered 2021-02-18: 4 mg via INTRAVENOUS

## 2021-02-18 SURGICAL SUPPLY — 40 items
BENZOIN TINCTURE PRP APPL 2/3 (GAUZE/BANDAGES/DRESSINGS) ×2 IMPLANT
CANISTER SUCT 1200ML W/VALVE (MISCELLANEOUS) ×2 IMPLANT
DECANTER SPIKE VIAL GLASS SM (MISCELLANEOUS) ×2 IMPLANT
ELECT COATED BLADE 2.86 ST (ELECTRODE) IMPLANT
ELECT NEEDLE BLADE 2-5/6 (NEEDLE) ×2 IMPLANT
ELECT REM PT RETURN 9FT ADLT (ELECTROSURGICAL) ×2
ELECTRODE REM PT RTRN 9FT ADLT (ELECTROSURGICAL) ×1 IMPLANT
GLOVE SURG ENC MOIS LTX SZ6.5 (GLOVE) ×4 IMPLANT
GLOVE SURG HYDRASOFT LTX SZ5.5 (GLOVE) ×4 IMPLANT
GLOVE SURG UNDER POLY LF SZ7 (GLOVE) ×4 IMPLANT
GOWN STRL REUS W/ TWL LRG LVL3 (GOWN DISPOSABLE) ×3 IMPLANT
GOWN STRL REUS W/TWL LRG LVL3 (GOWN DISPOSABLE) ×6
IMPL BARRIER ORB 38X50X1.0 (Mesh General) ×1 IMPLANT
IMPLANT BARRIER ORB 38X50X1.0 (Mesh General) ×2 IMPLANT
NEEDLE BLUNT 17GA (NEEDLE) IMPLANT
NEEDLE HYPO 30GX1 BEV (NEEDLE) IMPLANT
NEEDLE PRECISIONGLIDE 27X1.5 (NEEDLE) ×2 IMPLANT
NS IRRIG 1000ML POUR BTL (IV SOLUTION) ×2 IMPLANT
PACK BASIN DAY SURGERY FS (CUSTOM PROCEDURE TRAY) ×2 IMPLANT
PACK ENT DAY SURGERY (CUSTOM PROCEDURE TRAY) ×2 IMPLANT
PENCIL SMOKE EVACUATOR (MISCELLANEOUS) ×2 IMPLANT
SCREW UPPERFACE 1.2X4M SLF TAP (Screw) ×2 IMPLANT
SCREW UPPERFACE 1.2X4M SLFDRIL (Screw) ×2 IMPLANT
SHEILD EYE MED CORNL SHD 22X21 (OPHTHALMIC RELATED) ×2
SHIELD EYE MED CORNL SHD 22X21 (OPHTHALMIC RELATED) ×1 IMPLANT
SLEEVE SCD COMPRESS KNEE MED (STOCKING) ×2 IMPLANT
STAPLER VISISTAT 35W (STAPLE) IMPLANT
STRIP CLOSURE SKIN 1/2X4 (GAUZE/BANDAGES/DRESSINGS) ×2 IMPLANT
SUT MON AB 5-0 P3 18 (SUTURE) IMPLANT
SUT PROLENE 6 0 P 1 18 (SUTURE) IMPLANT
SUT SILK 4 0 P 3 (SUTURE) ×2 IMPLANT
SUT VIC AB 4-0 P-3 18XBRD (SUTURE) IMPLANT
SUT VIC AB 4-0 P3 18 (SUTURE)
SUT VIC AB 4-0 SH 27 (SUTURE)
SUT VIC AB 4-0 SH 27XANBCTRL (SUTURE) IMPLANT
SUT VIC AB 5-0 P-3 18X BRD (SUTURE) IMPLANT
SUT VIC AB 5-0 P3 18 (SUTURE)
SYR BULB EAR ULCER 3OZ GRN STR (SYRINGE) ×2 IMPLANT
TOWEL GREEN STERILE FF (TOWEL DISPOSABLE) ×2 IMPLANT
TRAY DSU PREP LF (CUSTOM PROCEDURE TRAY) ×2 IMPLANT

## 2021-02-18 NOTE — Anesthesia Procedure Notes (Signed)
Procedure Name: Intubation Performed by: Verita Lamb, CRNA Pre-anesthesia Checklist: Patient identified, Emergency Drugs available, Suction available and Patient being monitored Patient Re-evaluated:Patient Re-evaluated prior to induction Oxygen Delivery Method: Circle system utilized Preoxygenation: Pre-oxygenation with 100% oxygen Induction Type: IV induction Ventilation: Mask ventilation without difficulty Laryngoscope Size: Mac and 4 Grade View: Grade I Tube type: Oral Rae Tube size: 7.0 mm Number of attempts: 1 Airway Equipment and Method: Stylet and Oral airway Placement Confirmation: ETT inserted through vocal cords under direct vision,  positive ETCO2,  breath sounds checked- equal and bilateral and CO2 detector Secured at: 23 cm Tube secured with: Tape Dental Injury: Teeth and Oropharynx as per pre-operative assessment

## 2021-02-18 NOTE — Interval H&P Note (Deleted)
History and Physical Interval Note:  02/18/2021 12:19 PM  Jose Mccullough  has presented today for surgery, with the diagnosis of right orbital floor blowout fracture.  The various methods of treatment have been discussed with the patient and family. After consideration of risks, benefits and other options for treatment, the patient has consented to  Procedure(s): OPEN TREATMENT RIGHT ORBITAL FLOOR FRACTURE WITH IMPLANT (Right) as a surgical intervention.  The patient's history has been reviewed, patient examined, no change in status, stable for surgery.  I have reviewed the patient's chart and labs.  Questions were answered to the patient's satisfaction.     Irean Hong Lavell Supple

## 2021-02-18 NOTE — Anesthesia Preprocedure Evaluation (Addendum)
Anesthesia Evaluation  Patient identified by MRN, date of birth, ID band Patient awake    Reviewed: Allergy & Precautions, NPO status , Patient's Chart, lab work & pertinent test results  Airway Mallampati: III  TM Distance: >3 FB Neck ROM: Full  Mouth opening: Limited Mouth Opening  Dental no notable dental hx. (+) Teeth Intact, Dental Advisory Given   Pulmonary neg pulmonary ROS, Current Smoker,    Pulmonary exam normal breath sounds clear to auscultation       Cardiovascular negative cardio ROS Normal cardiovascular exam Rhythm:Regular Rate:Normal     Neuro/Psych PSYCHIATRIC DISORDERS Anxiety negative neurological ROS     GI/Hepatic negative GI ROS, Neg liver ROS,   Endo/Other  negative endocrine ROS  Renal/GU negative Renal ROS  negative genitourinary   Musculoskeletal negative musculoskeletal ROS (+)   Abdominal   Peds  Hematology negative hematology ROS (+)   Anesthesia Other Findings   Reproductive/Obstetrics                            Anesthesia Physical Anesthesia Plan  ASA: II  Anesthesia Plan: General   Post-op Pain Management:    Induction: Intravenous  PONV Risk Score and Plan: 1 and Midazolam, Dexamethasone and Ondansetron  Airway Management Planned: Oral ETT  Additional Equipment:   Intra-op Plan:   Post-operative Plan: Extubation in OR  Informed Consent: I have reviewed the patients History and Physical, chart, labs and discussed the procedure including the risks, benefits and alternatives for the proposed anesthesia with the patient or authorized representative who has indicated his/her understanding and acceptance.     Dental advisory given  Plan Discussed with: CRNA  Anesthesia Plan Comments:         Anesthesia Quick Evaluation

## 2021-02-18 NOTE — Interval H&P Note (Signed)
History and Physical Interval Note:  02/18/2021 12:36 PM  Jose Mccullough  has presented today for surgery, with the diagnosis of right orbital floor blowout fracture.  The various methods of treatment have been discussed with the patient and family. After consideration of risks, benefits and other options for treatment, the patient has consented to  Procedure(s): OPEN TREATMENT RIGHT ORBITAL FLOOR FRACTURE WITH IMPLANT (Right) as a surgical intervention.  The patient's history has been reviewed, patient examined, no change in status, stable for surgery.  I have reviewed the patient's chart and labs.  Questions were answered to the patient's satisfaction.     Jose Mccullough

## 2021-02-18 NOTE — Transfer of Care (Signed)
Immediate Anesthesia Transfer of Care Note  Patient: Jose Mccullough  Procedure(s) Performed: OPEN TREATMENT RIGHT ORBITAL FLOOR FRACTURE WITH IMPLANT (Right Eye)  Patient Location: PACU  Anesthesia Type:General  Level of Consciousness: awake, alert  and oriented  Airway & Oxygen Therapy: Patient Spontanous Breathing and Patient connected to face mask oxygen  Post-op Assessment: Report given to RN and Post -op Vital signs reviewed and stable  Post vital signs: Reviewed and stable  Last Vitals:  Vitals Value Taken Time  BP 145/106 02/18/21 1428  Temp    Pulse 87 02/18/21 1429  Resp 21 02/18/21 1429  SpO2 100 % 02/18/21 1429  Vitals shown include unvalidated device data.  Last Pain:  Vitals:   02/18/21 1207  TempSrc: Oral  PainSc: 0-No pain         Complications: No complications documented.
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
## 2021-02-19 ENCOUNTER — Encounter (HOSPITAL_BASED_OUTPATIENT_CLINIC_OR_DEPARTMENT_OTHER): Payer: Self-pay | Admitting: Plastic Surgery

## 2021-02-19 DIAGNOSIS — S0231XA Fracture of orbital floor, right side, initial encounter for closed fracture: Secondary | ICD-10-CM | POA: Diagnosis not present

## 2021-02-19 DIAGNOSIS — F17200 Nicotine dependence, unspecified, uncomplicated: Secondary | ICD-10-CM | POA: Diagnosis not present

## 2021-02-19 MED ORDER — HYDROCODONE-ACETAMINOPHEN 5-325 MG PO TABS: 1.0000 | ORAL_TABLET | Freq: Four times a day (QID) | ORAL | 0 refills | Status: AC | PRN

## 2021-02-19 NOTE — Anesthesia Postprocedure Evaluation (Signed)
Anesthesia Post Note  Patient: Jose Mccullough  Procedure(s) Performed: OPEN TREATMENT RIGHT ORBITAL FLOOR FRACTURE WITH IMPLANT (Right Eye)     Patient location during evaluation: PACU Anesthesia Type: General Level of consciousness: awake and alert Pain management: pain level controlled Vital Signs Assessment: post-procedure vital signs reviewed and stable Respiratory status: spontaneous breathing, nonlabored ventilation, respiratory function stable and patient connected to nasal cannula oxygen Cardiovascular status: blood pressure returned to baseline and stable Postop Assessment: no apparent nausea or vomiting Anesthetic complications: no   No complications documented.  Last Vitals:  Vitals:   02/19/21 0630 02/19/21 0730  BP:    Pulse: 76 82  Resp:    Temp:    SpO2: 95% 97%    Last Pain:  Vitals:   02/19/21 0830  TempSrc:   PainSc: 4                  Blonnie Maske L Estiben Mizuno

## 2021-02-19 NOTE — Discharge Summary (Signed)
Physician Discharge Summary  Patient ID: Jose Mccullough MRN: [id number] DOB/AGE: [birth year] 37 y.o.  Admit date: 02/18/2021 Discharge date: 02/19/2021  Admission Diagnoses: Closed blow-out fracture of right orbital floor   Discharge Diagnoses:  Active Problems:   Closed blow-out fracture of right orbital floor [facility])   Discharged Condition: stable  Hospital Course: Post operatively patient maintained on vision checks, Tobradex ophthalmic ointment. Patient tolerated diet and pain controlled. No nausea or emesis. Instructed to resume ophthalmic ointment as prescribed by Ophthalmology. May d/c Tobradex upon discharge.  Treatments: surgery: open treatment right orbital floor fracture with implant 5.2.22  Discharge Exam: Blood pressure (!) 132/94, pulse 76, temperature 98.3 F (36.8 C), resp. rate 16, height 5\' 11"  (1.803 m), weight 81.3 kg, SpO2 95 %. Eyes: EOMI expected edema and ecchymoses frost stitch removed gross vision intact  Disposition: Discharge disposition: 01-Home or Self Care       Discharge Instructions    Call MD for:  redness, tenderness, or signs of infection (pain, swelling, bleeding, redness, odor or green/yellow discharge around incision site)   Complete by: As directed    Call MD for:  temperature >100.5   Complete by: As directed    Discharge instructions   Complete by: As directed    Ok to shower am 5.3.22. Soap and water ok, pat dry. Resume ophthalmic ointment to right eye as prescribed by your ophthalmologist.  Ice packs for comfort. Sleep with head elevated on 2-3 pillows through follow up visit. No house yard work or exercise until cleared by MD. Recommend Miralax or Dulcolax as needed for constipation.   Driving Restrictions   Complete by: As directed    No driving if taking prescription pain medication   Lifting restrictions   Complete by: As directed    No lifting > 5-10 lb until cleared by MD   Resume previous diet   Complete by: As directed       Allergies as of 02/19/2021      Reactions   Ativan [lorazepam]    Caused depression   Penicillins    CHILDHOOD      Medication List    TAKE these medications   ALPRAZolam 0.5 MG dissolvable tablet Commonly known as: NIRAVAM DISSOLVE 1 TABLET BY MOUTH AT BEDTIME AS NEEDED FOR ANXIETY   buPROPion 100 MG 12 hr tablet Commonly known as: WELLBUTRIN SR TAKE 1 TABLET DAILY IN THE MORNING FOR ONE WEEK. THEN INCREASE TO 1 TABLET TWICE DAILY   busPIRone 15 MG tablet Commonly known as: BUSPAR TAKE 1 TABLET BY MOUTH 3 TIMES DAILY.   cephALEXin 500 MG capsule Commonly known as: KEFLEX Take 1 capsule (500 mg total) by mouth 4 (four) times daily.   HYDROcodone-acetaminophen 5-325 MG tablet Commonly known as: Norco Take 1-2 tablets by mouth every 6 (six) hours as needed.       Follow-up Information    04/21/2021, MD In 1 week.   Specialty: Plastic Surgery Why: as scheduled Contact information: [address] [place] [address]               Signed: [phone] 02/19/2021, 7:01 AM

## 2021-02-20 DIAGNOSIS — F411 Generalized anxiety disorder: Secondary | ICD-10-CM | POA: Diagnosis not present

## 2021-02-28 DIAGNOSIS — H209 Unspecified iridocyclitis: Secondary | ICD-10-CM | POA: Diagnosis not present

## 2021-03-28 DIAGNOSIS — H209 Unspecified iridocyclitis: Secondary | ICD-10-CM | POA: Diagnosis not present

## 2021-04-09 DIAGNOSIS — Z131 Encounter for screening for diabetes mellitus: Secondary | ICD-10-CM | POA: Diagnosis not present

## 2021-04-09 DIAGNOSIS — M129 Arthropathy, unspecified: Secondary | ICD-10-CM | POA: Diagnosis not present

## 2021-04-09 DIAGNOSIS — I10 Essential (primary) hypertension: Secondary | ICD-10-CM | POA: Diagnosis not present

## 2021-04-09 DIAGNOSIS — F17210 Nicotine dependence, cigarettes, uncomplicated: Secondary | ICD-10-CM | POA: Diagnosis not present

## 2021-04-09 DIAGNOSIS — M109 Gout, unspecified: Secondary | ICD-10-CM | POA: Diagnosis not present

## 2021-04-09 DIAGNOSIS — R5383 Other fatigue: Secondary | ICD-10-CM | POA: Diagnosis not present

## 2021-04-09 DIAGNOSIS — Z13220 Encounter for screening for lipoid disorders: Secondary | ICD-10-CM | POA: Diagnosis not present

## 2021-04-25 DIAGNOSIS — I10 Essential (primary) hypertension: Secondary | ICD-10-CM | POA: Diagnosis not present

## 2021-04-25 DIAGNOSIS — F419 Anxiety disorder, unspecified: Secondary | ICD-10-CM | POA: Diagnosis not present

## 2021-04-25 DIAGNOSIS — M79671 Pain in right foot: Secondary | ICD-10-CM | POA: Diagnosis not present

## 2021-04-25 DIAGNOSIS — F17210 Nicotine dependence, cigarettes, uncomplicated: Secondary | ICD-10-CM | POA: Diagnosis not present

## 2021-04-25 DIAGNOSIS — Z6825 Body mass index (BMI) 25.0-25.9, adult: Secondary | ICD-10-CM | POA: Diagnosis not present

## 2021-06-08 DIAGNOSIS — F410 Panic disorder [episodic paroxysmal anxiety] without agoraphobia: Secondary | ICD-10-CM | POA: Diagnosis not present

## 2021-06-08 DIAGNOSIS — F411 Generalized anxiety disorder: Secondary | ICD-10-CM | POA: Diagnosis not present

## 2021-06-08 DIAGNOSIS — Z008 Encounter for other general examination: Secondary | ICD-10-CM | POA: Diagnosis not present

## 2021-06-08 DIAGNOSIS — Z79899 Other long term (current) drug therapy: Secondary | ICD-10-CM | POA: Diagnosis not present

## 2021-06-27 ENCOUNTER — Other Ambulatory Visit: Payer: Self-pay

## 2021-06-27 ENCOUNTER — Encounter (HOSPITAL_COMMUNITY): Payer: Self-pay

## 2021-06-27 ENCOUNTER — Emergency Department (HOSPITAL_COMMUNITY)
Admission: EM | Admit: 2021-06-27 | Discharge: 2021-06-28 | Disposition: A | Discharge status: Home / Self Care | Payer: BC Managed Care – PPO | Attending: Emergency Medicine | Admitting: Emergency Medicine

## 2021-06-27 DIAGNOSIS — R519 Headache, unspecified: Secondary | ICD-10-CM | POA: Diagnosis not present

## 2021-06-27 DIAGNOSIS — Z5321 Procedure and treatment not carried out due to patient leaving prior to being seen by health care provider: Secondary | ICD-10-CM | POA: Insufficient documentation

## 2021-06-27 DIAGNOSIS — R059 Cough, unspecified: Secondary | ICD-10-CM | POA: Diagnosis not present

## 2021-06-27 DIAGNOSIS — R509 Fever, unspecified: Secondary | ICD-10-CM | POA: Insufficient documentation

## 2021-06-27 NOTE — ED Triage Notes (Signed)
Chills, headache, fever, cough x1 day.

## 2021-06-27 NOTE — ED Notes (Signed)
Called from waiting room with no response x1

## 2021-06-28 DIAGNOSIS — U071 COVID-19: Secondary | ICD-10-CM | POA: Diagnosis not present

## 2021-06-28 NOTE — ED Notes (Signed)
Called 3x for room placement. Eloped.  

## 2021-07-04 DIAGNOSIS — F1020 Alcohol dependence, uncomplicated: Secondary | ICD-10-CM | POA: Diagnosis not present

## 2021-07-04 DIAGNOSIS — F410 Panic disorder [episodic paroxysmal anxiety] without agoraphobia: Secondary | ICD-10-CM | POA: Diagnosis not present

## 2021-07-04 DIAGNOSIS — Z1379 Encounter for other screening for genetic and chromosomal anomalies: Secondary | ICD-10-CM | POA: Diagnosis not present

## 2021-07-04 DIAGNOSIS — F411 Generalized anxiety disorder: Secondary | ICD-10-CM | POA: Diagnosis not present

## 2021-07-11 DIAGNOSIS — Z6824 Body mass index (BMI) 24.0-24.9, adult: Secondary | ICD-10-CM | POA: Diagnosis not present

## 2021-07-11 DIAGNOSIS — I10 Essential (primary) hypertension: Secondary | ICD-10-CM | POA: Diagnosis not present

## 2021-07-11 DIAGNOSIS — F17210 Nicotine dependence, cigarettes, uncomplicated: Secondary | ICD-10-CM | POA: Diagnosis not present

## 2021-07-11 DIAGNOSIS — Z0000 Encounter for general adult medical examination without abnormal findings: Secondary | ICD-10-CM | POA: Diagnosis not present

## 2021-07-11 DIAGNOSIS — R0602 Shortness of breath: Secondary | ICD-10-CM | POA: Diagnosis not present

## 2021-07-11 DIAGNOSIS — Z1159 Encounter for screening for other viral diseases: Secondary | ICD-10-CM | POA: Diagnosis not present

## 2021-07-11 DIAGNOSIS — E7800 Pure hypercholesterolemia, unspecified: Secondary | ICD-10-CM | POA: Diagnosis not present

## 2021-09-10 DIAGNOSIS — I10 Essential (primary) hypertension: Secondary | ICD-10-CM | POA: Diagnosis not present

## 2021-09-10 DIAGNOSIS — Z0289 Encounter for other administrative examinations: Secondary | ICD-10-CM | POA: Diagnosis not present

## 2021-09-10 DIAGNOSIS — F411 Generalized anxiety disorder: Secondary | ICD-10-CM | POA: Diagnosis not present

## 2021-09-10 DIAGNOSIS — Z6825 Body mass index (BMI) 25.0-25.9, adult: Secondary | ICD-10-CM | POA: Diagnosis not present

## 2021-09-11 DIAGNOSIS — F411 Generalized anxiety disorder: Secondary | ICD-10-CM | POA: Diagnosis not present

## 2021-09-11 DIAGNOSIS — F1020 Alcohol dependence, uncomplicated: Secondary | ICD-10-CM | POA: Diagnosis not present

## 2021-09-11 DIAGNOSIS — F410 Panic disorder [episodic paroxysmal anxiety] without agoraphobia: Secondary | ICD-10-CM | POA: Diagnosis not present

## 2021-09-11 DIAGNOSIS — F429 Obsessive-compulsive disorder, unspecified: Secondary | ICD-10-CM | POA: Diagnosis not present

## 2021-11-07 ENCOUNTER — Emergency Department (HOSPITAL_COMMUNITY)
Admission: EM | Admit: 2021-11-07 | Discharge: 2021-11-07 | Disposition: A | Discharge status: Home / Self Care | Payer: BC Managed Care – PPO | Attending: Emergency Medicine | Admitting: Emergency Medicine

## 2021-11-07 ENCOUNTER — Emergency Department (HOSPITAL_COMMUNITY): Payer: BC Managed Care – PPO

## 2021-11-07 ENCOUNTER — Encounter (HOSPITAL_COMMUNITY): Payer: Self-pay | Admitting: Emergency Medicine

## 2021-11-07 DIAGNOSIS — R079 Chest pain, unspecified: Secondary | ICD-10-CM | POA: Diagnosis not present

## 2021-11-07 DIAGNOSIS — M546 Pain in thoracic spine: Secondary | ICD-10-CM | POA: Diagnosis not present

## 2021-11-07 DIAGNOSIS — Y92410 Unspecified street and highway as the place of occurrence of the external cause: Secondary | ICD-10-CM | POA: Insufficient documentation

## 2021-11-07 DIAGNOSIS — M542 Cervicalgia: Secondary | ICD-10-CM | POA: Diagnosis present

## 2021-11-07 MED ORDER — METHOCARBAMOL 500 MG PO TABS
750.0000 mg | ORAL_TABLET | Freq: Once | ORAL | Status: DC
  Filled 2021-11-07: qty 2

## 2021-11-07 MED ORDER — METHOCARBAMOL 500 MG PO TABS: 500.0000 mg | ORAL_TABLET | Freq: Two times a day (BID) | ORAL | 0 refills | Status: DC

## 2021-11-07 MED ORDER — METHOCARBAMOL 500 MG PO TABS: 500.0000 mg | ORAL_TABLET | Freq: Two times a day (BID) | ORAL | 0 refills | Status: AC
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
